# Patient Record
Sex: Male | Born: 1935 | Race: White | Hispanic: No | Smoking: Never smoker
Health system: Southern US, Community
[De-identification: ages and names within clinical notes are randomized; demographics above are authoritative.]

## PROBLEM LIST (undated history)

## (undated) DIAGNOSIS — M810 Age-related osteoporosis without current pathological fracture: Secondary | ICD-10-CM

## (undated) DIAGNOSIS — I495 Sick sinus syndrome: Secondary | ICD-10-CM

## (undated) DIAGNOSIS — H3530 Unspecified macular degeneration: Secondary | ICD-10-CM

## (undated) DIAGNOSIS — H9190 Unspecified hearing loss, unspecified ear: Secondary | ICD-10-CM

## (undated) HISTORY — DX: Age-related osteoporosis without current pathological fracture: M81.0

## (undated) HISTORY — DX: Unspecified macular degeneration: H35.30

## (undated) HISTORY — DX: Sick sinus syndrome: I49.5

---

## 1998-04-22 ENCOUNTER — Inpatient Hospital Stay (HOSPITAL_COMMUNITY): Admission: EM | Admit: 1998-04-22 | Discharge: 1998-04-22 | Payer: Self-pay | Admitting: Emergency Medicine

## 1998-04-22 ENCOUNTER — Encounter: Payer: Self-pay | Admitting: Emergency Medicine

## 1998-04-23 ENCOUNTER — Ambulatory Visit (HOSPITAL_COMMUNITY): Admission: RE | Admit: 1998-04-23 | Discharge: 1998-04-23 | Payer: Self-pay | Admitting: Internal Medicine

## 1998-04-23 ENCOUNTER — Encounter: Payer: Self-pay | Admitting: Internal Medicine

## 1998-05-14 ENCOUNTER — Ambulatory Visit (HOSPITAL_COMMUNITY): Admission: RE | Admit: 1998-05-14 | Discharge: 1998-05-14 | Payer: Self-pay | Admitting: Cardiology

## 1998-11-20 ENCOUNTER — Ambulatory Visit (HOSPITAL_COMMUNITY): Admission: RE | Admit: 1998-11-20 | Discharge: 1998-11-20 | Payer: Self-pay

## 1998-11-21 ENCOUNTER — Inpatient Hospital Stay (HOSPITAL_COMMUNITY): Admission: RE | Admit: 1998-11-21 | Discharge: 1998-11-24 | Payer: Self-pay

## 1999-02-13 ENCOUNTER — Ambulatory Visit (HOSPITAL_COMMUNITY): Admission: RE | Admit: 1999-02-13 | Discharge: 1999-02-13 | Payer: Self-pay | Admitting: Gastroenterology

## 2000-11-24 ENCOUNTER — Encounter: Payer: Self-pay | Admitting: Specialist

## 2000-11-29 ENCOUNTER — Inpatient Hospital Stay (HOSPITAL_COMMUNITY): Admission: RE | Admit: 2000-11-29 | Discharge: 2000-12-04 | Payer: Self-pay | Admitting: Specialist

## 2005-07-09 ENCOUNTER — Encounter: Admission: RE | Admit: 2005-07-09 | Discharge: 2005-07-09 | Payer: Self-pay | Admitting: General Surgery

## 2005-07-13 ENCOUNTER — Encounter (INDEPENDENT_AMBULATORY_CARE_PROVIDER_SITE_OTHER): Payer: Self-pay | Admitting: Pathology

## 2005-07-13 ENCOUNTER — Ambulatory Visit (HOSPITAL_BASED_OUTPATIENT_CLINIC_OR_DEPARTMENT_OTHER): Admission: RE | Admit: 2005-07-13 | Discharge: 2005-07-13 | Payer: Self-pay | Admitting: General Surgery

## 2007-08-08 ENCOUNTER — Inpatient Hospital Stay (HOSPITAL_COMMUNITY): Admission: RE | Admit: 2007-08-08 | Discharge: 2007-08-12 | Payer: Self-pay | Admitting: Orthopedic Surgery

## 2010-10-14 NOTE — H&P (Signed)
[name], [name]              ACCOUNT NO.:  [id number]   MEDICAL RECORD NO.:  [id number]          PATIENT TYPE:  INP   LOCATION:  1617                         FACILITY:  [facility]   PHYSICIAN:  Ollen Gross, M.D.    DATE OF BIRTH:  December 19, 1924   DATE OF ADMISSION:  08/08/2007  DATE OF DISCHARGE:                              HISTORY & PHYSICAL   DATE OF OFFICE VISIT:  The history and physical was done on July 28, 2007.  The date of hospital admission is August 08, 2007.   CHIEF COMPLAINT:  Right knee pain.   HISTORY OF PRESENT ILLNESS:  The patient is an 77 year old male who has  been seen by Dr. Lequita Halt for ongoing right knee pain.  He has undergone  left total knee back in 2000 per Dr. Thomasena Edis, and doing well with that.  The right knee has been hurting even prior to having the left knee; and  this has been ongoing for several years now.  He is at a point where he  would like to have something done.  He has been seen by Dr. Lequita Halt and  found to have end-stage arthritis; felt to be a good candidate.  He has  been seen preoperatively by Dr. Rodrigo Ran and felt to be stable for  upcoming surgery.   ALLERGIES:  NO KNOWN DRUG ALLERGIES.   CURRENT MEDICATIONS:  1. Ambien, but he is not using that on a regular basis now.  2. Aspirin 81 mg .  3. PreserVision.   PAST MEDICAL HISTORY:  Negative.   PAST SURGICAL HISTORY:  1. Left knee replacement 2000.  2. Gallbladder surgery in late 1990s.  3. Cataract surgery.   FAMILY HISTORY:  Father with heart attack.  Mother old age.   SOCIAL HISTORY:  Widowed.  Nonsmoker.  No alcohol.  Two children.  Lives  alone.  States he has someone  Lined up to help him after surgery.   REVIEW OF SYSTEMS:  GENERAL:  No fevers, chills or night sweats.  NEUROLOGIC:  No seizures, syncope or paralysis.  RESPIRATORY:  No shortness of breath, productive cough or hemoptysis.  CARDIOVASCULAR:  No chest pain, angina or orthopnea.  GI:  No nausea,  vomiting, diarrhea or constipation.  GU:  No dysuria, hematuria or discharge.  MUSCULOSKELETAL:  Right knee.   PHYSICAL EXAMINATION:  VITAL SIGNS:  Pulse 72, respirations 12, blood  pressure 124/64.  GENERAL:  An 77 year old white male well-nourished, well-developed, in  no acute distress.  Alert, oriented and cooperative.  Good historian.  HEENT:  Normocephalic, atraumatic.  Pupils are equal, round and  reactive.  Oropharynx clear.  EOMs intact.  Lower partial plate.  NECK:  Supple.  CHEST:  Clear at anterior posterior chest walls.  HEART:  Regular rate and rhythm.  No murmur, S1 and S2.  ABDOMEN:  Soft, nontender.  Bowel sounds present.  BREASTS/GENITALIA:  Not done, not pertinent to present illness.  EXTREMITIES:  Right knee varus malalignment and deformity.  Range of  motion 5-125.  No effusion, marked crepitus, tender more medial than  lateral.   IMPRESSION:  Osteoarthritis of the right knee.   PLAN:  The patient admitted to [facility] to undergo a right  total knee replacement arthroplasty.  Surgery will be performed by Dr.  Ollen Gross.      [name], P.A.C.      Ollen Gross, M.D.  Electronically Signed    ALP/MEDQ  D:  08/07/2007  T:  08/08/2007  Job:  [id number]   cc:   Loraine Leriche A. Perini, M.D.  Fax: [phone]

## 2010-10-14 NOTE — Op Note (Signed)
NAMEBENJY, KANA              ACCOUNT NO.:  [id number]   MEDICAL RECORD NO.:  [id number]          PATIENT TYPE:  INP   LOCATION:  0005                         [facility]ACILITY[facility]  [facility]   PHYSICIAN:  Ollen Gross, M.D.    DATE OF BIRTH:  Mar 19, 1925   DATE OF PROCEDURE:  08/08/2007  DATE OF DISCHARGE:                               OPERATIVE REPORT   PREOPERATIVE DIAGNOSIS:  Osteoarthritis, right knee.   POSTOPERATIVE DIAGNOSIS:  Osteoarthritis, right knee.   PROCEDURE:  Right total knee arthroplasty.   SURGEON:  Homero Fellers, MD   ASSISTANT:  Avel Peace PA-C   ANESTHESIA:  Spinal with Duramorph.   ESTIMATED BLOOD LOSS:  Minimal.   DRAINS:  None.   TOURNIQUET TIME:  Forty-one minutes at 300 mmHg.   COMPLICATIONS:  None.   CONDITION:  Stable, to Recovery.   CLINICAL NOTE:  Mr. Ching is an 77 -year-old male with severe end-stage  arthritis of the right knee with progressively worsening pain and  dysfunction.  He has had a previous successful left total knee  arthroplasty and presents now for right total knee arthroplasty.   PROCEDURE IN DETAIL:  After the successful administration of spinal  anesthetic, a tourniquet was placed high on the right thigh and right  lower extremity prepped and draped in the usual sterile fashion.  Extremities was wrapped in Esmarch, knee flexed and tourniquet inflated  to 300 mmHg.  A midline incision was made with 10 blade through  subcutaneous tissue to the level of the extensor mechanism.  A fresh  blade was used make a medial parapatellar arthrotomy.  Soft tissue over  the proximal medial tibia was subperiosteally elevated to the joint line  with the knife and into the semimembranosus bursa with a Cobb elevator.  Soft tissue laterally is elevated with attention being paid to avoiding  the patellar tendon on the tibial tubercle.  Patella was subluxed  laterally, knee flexed to 90 degrees, ACL and PCL removed.  Drill was  used create a starting  hole in the distal femur and the canal was  thoroughly irrigated.  A 5-degree right valgus alignment guide was  placed and referencing off the posterior condyles, rotation was marked  and the block pinned to remove 11 mm off the distal femur; I took 11  because of preop flexion contracture.  Distal femoral resection was made  with an oscillating saw.  Sizing block was placed and size 5 is most  appropriate.  Rotations marked at the epicondylar axis.  A size 5  cutting block was placed and the anterior, posterior and chamfer cuts  were made.   Tibia was subluxed forward and the menisci were removed.  The  extramedullary tibial alignment guide was placed, referencing proximally  at the medial aspect of the tibial tubercle and distally along the 2nd  metatarsal axis and tibial crest.  A block was pinned to remove 10 mm  off the non-deficient lateral side.  Tibial resection was made with an  oscillating saw.  A size 5 was most appropriate tibial component and the  proximal tibia was prepared with  a modular drill and keel punch for a  size 5.  Femoral preparation was completed with the intercondylar cut.   A size 5 mobile bearing tibial trial, a size 5 posterior stabilized  femoral trial and a 10-mm posterior stabilized rotating platform insert  trial were placed.  With the 10, full extension was achieved with a tiny  bit of varus-valgus placed.  I went to 12.5, which still allowed for  full extension with excellent varus and valgus balance and anterior-  posterior balance throughout full range of motion.  The patella was then  everted and thickness measured to be 25 mm.  Freehand resection was  taken to 13 mm, a 41 template was placed, lug holes were drilled, trial  patella was placed and it tracked normally.  Osteophytes were removed  off the posterior femur with the trial in place.  All trials were  removed and the cut bone surfaces were prepared with pulsatile lavage.  Cements was mixed  and once ready for implantation, a size 5 mobile  bearing tibial tray, a size 5 posterior stabilized femur and 41 patella  were cemented into place and the patella was held with a clamp.  Trial  12.5 insert was placed and knee held in full extension and all extruded  cement removed.  When the cement had fully hardened, then the wound was  copiously irrigated with saline solution.  FloSeal was injected on the  posterior capsule and the permanent 12.5-mm posterior stabilized  rotating platform insert was placed into the tibial tray.  FloSeal was  injected in the mediolateral gutters and suprapatellar area.  Moist  sponge was placed and tourniquet released for total time of 41 minutes.  We  held the sponge for 2 minutes, then removed it.  Minimal bleeding  was encountered; that which was encountered was stopped with  electrocautery.  I then irrigated again and closed the arthrotomy with  interrupted #1 PDS.  Flexion against gravity was 140 degrees.  Subcu was  closed with interrupted 2-0 Vicryl and subcuticular running 4-0  Monocryl.  The incisions was cleaned and dried and Steri-Strips and a  bulky sterile dressing applied.  He was placed into a knee immobilizer,  awakened and transported to Recovery in stable condition.      Ollen Gross, M.D.  Electronically Signed     FA/MEDQ  D:  08/08/2007  T:  08/09/2007  Job:  [id number]

## 2010-10-14 NOTE — Discharge Summary (Signed)
NAMEASHKAN, CHAMBERLAND              ACCOUNT NO.:  [id number]   MEDICAL RECORD NO.:  [id number]          PATIENT TYPE:  INP   LOCATION:  1617                         FACILITY:  [facility]   PHYSICIAN:  Ollen Gross, M.D.    DATE OF BIRTH:  [birth year]   DATE OF ADMISSION:  08/08/2007  DATE OF DISCHARGE:  08/12/2007                               DISCHARGE SUMMARY   ADMISSION DIAGNOSIS:  Osteoarthritis, right knee.   DISCHARGE DIAGNOSES:  1. Osteoarthritis, right knee, status post right total knee      replacement arthroplasty.  2. Mild postoperative hypoxia, resolved.  3. Mild postoperative probable atelectasis.   PROCEDURE:  August 08, 2007:  Right total knee.  Surgeon Dr. Lequita Halt,  assistant Avel Peace, P.A.C.  Surgery under spinal with Duramorph.   CONSULTATIONS:  None.   BRIEF HISTORY:  Mr. Krist is an 77 year old male with severe end-stage  arthritis of the right knee with progressive worsening pain and  dysfunction, successful left total knee, now presents for right total  knee.   LABORATORY DATA:  Preop CBC showed a hemoglobin of 15.3, hematocrit of  44.4, white cell count 8.1, red cell count 4.93, platelets 219.  Preop  UA only showed trace leukocyte esterase, 3 to 6 white cells, 0 to 2 red  cells, rare bacteria.  Chem panel on admission:  Slightly high protein  of 8.5.  Remaining chem panel within normal limits.  Protime INR 13.9  with 1.1 and PTT of 29.  Serial CBCs were followed.  Postop hemoglobin  from 11.4 drifted down to 9.8.  Last noted H&H was 9.8 and 27.9.  Serial  protimes followed, PT/INR 20.2, 1.7.  Serial BMETs were followed.  Electrolytes remained within normal limits.   X-rays:  Preop chest x-ray dated July 09, 2005.  No active  cardiopulmonary disease.  Portable chest August 10, 2007.  Opacity left  lung base with curvilinear superior margin.  Finding could represent an  elevation of the left hemidiaphragm.  Could be a new finding compared to  2007.   Potentially consolidation or atelectasis, left lung base.  Evaluate PA and lateral chest.  Hazy opacity, medial right lung base is  favored, representing crowding or pulmonary vascular related to lung  volumes and atelectasis.  Followup chest again on August 10, 2007:  Re-  expansion of the previously identified left basilar opacity consistent  with resolution of atelectasis, borderline cardiomegaly on current exam.  No evidence of acute cardiopulmonary disease.  Spiral CT taken on August 11, 2007:  Do not have the final report but preliminary report read as  no acute infiltrate, no pulmonary embolism, small right pleural  effusion, no edema.   EKG June 15, 2007:  Normal sinus rhythm, borderline, unable to read  the whole interpretation.  No significant ST/T changes.   HOSPITAL COURSE:  The patient admitted to [facility],  tolerated the procedure well, later transferred to the recovery room on  orthopedic floor, started on PCA and p.o. analgesic pain control  following surgery, given 24 hours postop IV antibiotics.  Started on  Coumadin for DVT  prophylaxis.  Actually doing pretty well on the morning  of day one.  Seen in rounds.  Starting to get up out of bed.  Hemoglobin  was 11.4.  Had decent output, a little bit on the lower side, so we  increased his fluids, gave him a mild fluid challenge, and that helped  out with his output, which was much better on day two.  Seen in rounds,  starting feeling okay.  Discontinued the PCA and the fluids, although  later on that day it was noted that he was having a little bit of drop  in his O2 sats when he was up moving around and talking.  It got down  into the mid 80s while he was on nasal cannula.  We checked it on room  air and it was down to the 80s, put him back on the nasal cannula and it  was up and down, so we checked an initial chest x-ray.  There was  possible consolidation or atelectasis.  Recommended dedicated films.  We   did do dedicated films later that afternoon and it showed resolution of  probable atelectasis.  He started getting up with therapy and walked  about 40 feet.  By the following day of March 12, he was doing much  better.  X-rays yesterday had improved although he still had a little  bit of a drop in his O2 sat through later that evening and night, so on  the morning rounds we ordered a CT spiral just to make sure he did not  have any emboli.  The CT did prove to be negative.  No infiltrates.  No  emboli.  Incision was checked.  Dressing changes started on day 2 and  checked daily.  Incision was healing well.  He continued to progress  very well, walking 120 feet.  We kept him one more day just to make sure  he was weaned off his O2, and when seen on rounds on August 12, 2007 he  was already sitting up in the bed dressed.  No complaints of shortness  of breath, no complaints of chest pain.  Moving his bowels.  His room  sats were 91.  Tolerating his medications, voiding well, and was  discharged home.   DISCHARGE PLAN:  1. The patient was discharged home on August 12, 2007.  2. Discharge diagnoses:  Please see above.  3. Discharge medications:  Coumadin, Percocet, Robaxin, Nu-Iron.   ACTIVITIES:  He is weightbearing as tolerated to the right lower  extremity, total knee protocol.  Home health PT and home nursing.   DIET:  Resume home diet as tolerated.   FOLLOWUP:  He needs to follow up with Dr. Lequita Halt in about 2 weeks.  Follow up on either Tuesday or Thursday, March 24 or March 26.  Call the  office for an appointment, [phone].   DISPOSITION:  Home.   CONDITION ON DISCHARGE:  Improving.      [name], P.A.C.      Ollen Gross, M.D.  Electronically Signed   ALP/MEDQ  D:  08/12/2007  T:  08/13/2007  Job:  [id number]   cc:   Ollen Gross, M.D.  Fax: [phone]   Mark A. Perini, M.D.  Fax: [phone]

## 2010-10-17 NOTE — Procedures (Signed)
[place]  Patient:    [name], [name]                     MRN: [id number] Proc. Date: 11/29/00 Adm. Date:  [id number] Attending:  Erasmo Leventhal CC:         Anesthesia Department   Procedure Report  PROCEDURE:  Epidural catheter placement for postoperative pain relief.  DESCRIPTION OF PROCEDURE:  I was consulted by Dr. Eugenia Mcalpine to place an epidural catheter into Mr. [name] for postoperative pain relief.  I had the opportunity to discuss the procedure with the patient, and he agreed to the catheter placement.  At the end of the procedure, prior to emergence from anesthesia, the patient was turned into the right lateral decubitus position.  His back was prepped with Betadine.  Using a #17 Tuohy needle, the epidural space was easily cannulated at the L2-3 interspace in the midline using loss of resistance technique.  A catheter was then placed 5 cm into the epidural space and the needle removed.  After negative aspiration, 100 mcg of fentanyl and 10 cc of 0.5% Xylocaine was injected.  The catheter was then affixed to the patients back.  He was turned supine, extubated, and taken to the recovery room in stable condition.  In the PACU, a fentanyl/morphine infusion was begun.  He will be followed on the floor by the anesthesiology service. DD:  11/29/00 TD:  11/29/00 Job: [id number]

## 2010-10-17 NOTE — Op Note (Signed)
[place]  Patient:    [name], [name]                     MRN: [id number] Proc. Date: 11/29/00 Adm. Date:  [id number] Attending:  Erasmo Leventhal                           Operative Report  PREOPERATIVE DIAGNOSIS:  Left knee end-stage osteoarthritis.  POSTOPERATIVE DIAGNOSIS:  Left knee end-stage osteoarthritis.  PROCEDURE:  Left total knee arthroplasty.  SURGEON:  R. Valma Cava, M.D.  ASSISTANT:  [name], P.A.-C.  ANESTHESIA:  General followed by epidural.  ESTIMATED BLOOD LOSS:  Less than 200 cc.  DRAINS:  Hemovac.  COMPLICATIONS:  None.  TOURNIQUET TIME:  1 hour 23 minutes at 375 mmHg.  IMPLANTS:  Osteonics components all cemented, sizes:  #11 to femur, #11 tibia, 10 mm polyethylene insert, 28 mm patella.  DESCRIPTION OF PROCEDURE:  The patient was counseled in the holding area. Correct size was identified.  IV was started and antibiotics were given.  He was taken to the OR and placed in the supine position under general anesthesia.  Left knee examined.  8 degree flexion contracture, flexed to 120 degrees elevated.  Foley catheter in place, utilizing sterile technique by the OR circulating nurse.  The left lower extremity was elevated, prepped with Duraprep, and draped in the usual sterile fashion.    Exsanguinated with an Esmarch and tourniquet was inflated to 375 mmHg.  Straight midline incision was made through the skin and subcutaneous tissue.  Medial and lateral skin flaps were developed at the appropriate plane.  Medial parapatellar arthrotomy was performed.  Patella was everted.  Medial soft tissue release was done on the tibia.  Cruciate ligaments were resected.  Interestingly an intercondylar notch made it look like there was a ganglion cyst of the PCL, but there was some hemosiderin stain.  This was excised.  The posterior capsule was not violated.  Instead the intra-articular and debrided this  area.  It had a benign appearance.  Nonspecific.  The distal femur canal was entered and irrigated.  Intermedullary rod was gently placed.  I took a 5 degree valgus cut with a 12 mm cut off the distal femur.  The distal femur was found to be a size #9.  Rotational marks were made and distal femur was cut to fit a size #9. Medial and lateral menisci were removed, geniculits were coagulated, tibial hemorrhage was resected and osteophytes removed from the proximal medial tibia.  We had bone-on-bone contact with severe grooving on the medial side.  The proximal tibia was found to be a size #11.  It was cut to a depth of 10 mm initially with a 5 degree posterior slope had been set and then went back later and took another 2 mm off.  Posterior medial and posterolateral femoral osteophytes were removed under direct visualization.  Femoral trochlea was prepared in the standard fashion.  With trials of 11 femur, 11 tibia, 10 mm polyethylene insert at this point in time we had excellent range of motion and soft tissue balance.  Rotational marks were made in the depth of keel was performed in standard fashion.  The patella was found to be a size 28 and was reamed to a depth of 10 mm. Locking holes were made and excess bone was removed.  At this point in time utilizing modern cement  technique, all components were cemented in place, size #11 tibia, size #11 femur, 28 mm patella.  At this point in time with trials and 10 mm insert, we had excellent range of motion and soft tissue balance. This trial was then removed and 10 mm polyethylene tibial insert was then placed.  Bone wax was placed in the exposed bone areas.  Hemostasis was obtained.  Two medium hemovac drains were placed and a lateral release of the retinaculum and capsule was done.  The synovium was left intact with tracking anatomic.  Sponges were placed, wrapped in Ace wrap, and tourniquet was deflated.  After allowing the knee to coagulate,  hemostasis was obtained.  The knee was irrigated with antibiotic solution during the closure.  A sequential closure in layers were done with the arthrotomy Vicryl, subcu Vicryl, and skin closed with subcuticular monocryl suture.  Benzoin and Steri-Strips were applied.  Drain was then hooked to suction.  Sterile dressings were applied to the knee.  Ice pack and knee immobilizer.  He had excellent pulses in the foot and ankle at the end of the case.  He was given 1 mg of Ancef when the tourniquet was deflated.  He was then turned lateral and the epidural was administered.  He was then taken to the recovery room in stable condition.  No complications.  Sponge, needle, and instrument counts were correct. DD:  11/29/00 TD:  11/29/00 Job: [id number]

## 2010-10-17 NOTE — H&P (Signed)
[place]. [facility]  Patient:    [name], [name]                       MRN: [id number] Adm. Date:  11/29/00 Attending:  R. Valma Cava, M.D. Dictator:   Colleen P. Mahar, P.A.-C. CC:         Rodrigo Ran, M.D.   History and Physical  DATE OF BIRTH:  02-05-1925  CHIEF COMPLAINT:  Left knee pain.  HISTORY OF PRESENT ILLNESS:  Chronic history of bilateral knee pain, left greater than right.  He has failed conservative management.  The pain has been increasing in severity and is currently interfering with activities of daily living.  PAST MEDICAL HISTORY:  The patient is healthy, with no medical problems.  ALLERGIES:  No known drug allergies.  CURRENT MEDICATIONS: 1. Aspirin 81 mg q.d.  He is instructed to stop this as of today. 2. Ocuvite vitamins. 3. Calcium supplement.  PAST SURGICAL HISTORY:  Cholecystectomy.  SOCIAL HISTORY:  He denies tobacco use.  He denies alcohol use.  He lives with his wife.  He plans to go home with home health physical therapy and nursing after surgery if at all possible.  The patients primary medical doctor is Dr. Rodrigo Ran.  FAMILY HISTORY:  Mother deceased at age 30.  She was healthy until the time of her death.  Father is deceased at age 87, with a history of a CVA.  Siblings: History of a myocardial infarction and lung cancer.  REVIEW OF SYSTEMS:  Denies fever, chills, night sweats, bleeding tendencies, blurred vision, double vision, seizures, headaches, paralysis.  Denies shortness of breath, productive cough, hemoptysis.  Denies chest pain, angina, orthopnea, claudication.  Denies nausea, vomiting, constipation, diarrhea, melena, bloody stool.  Denies dysuria, hematuria, or discharge.  Denies any paresthesias, numbness, or weakness in the extremities.  PHYSICAL EXAMINATION:  VITAL SIGNS:  Blood pressure 122/72, respirations 14 and unlabored, pulse 60 and regular.  GENERAL:  The patient is a  77 year old male, in no acute distress.  He is alert and oriented, pleasant, cooperative to examination.  HEENT:  Head normocephalic, atraumatic.  Pupils equal, round, reactive to light.  Extraocular movements intact.  Pharynx clear.  NECK:  No bruits appreciated.  Soft to palpation.  No thyromegaly.  CHEST:  Clear to auscultation bilaterally.  BREASTS:  Not done, not pertinent.  HEART:  A regular rate and rhythm.  No murmurs, gallops, or rubs.  Normal S1, S2.  ABDOMEN:  Positive bowel sounds throughout.  Soft, supple, nondistended, nontender.  No organomegaly noted.  GENITOURINARY:  Not pertinent, not performed.  EXTREMITIES:  Left knee:  Varus deformity.  Range of motion is 5-110 degrees. There is crepitation with range of motion.  There is diffuse medial joint line tenderness.  SKIN:  Intact.  No erythema, rashes, or lesions.  X-RAYS:  Revealed end-stage osteoarthritis, bilateral varus malalignment of his knees, bone on bone contact bilateral knees, left greater than right.  IMPRESSION:  End-stage osteoarthritis, left knee greater than right knee.  PLAN:  Admit to [facility] on November 29, 2000, for a left knee arthroplasty by Dr. Benny Lennert.  Medical clearance was obtained from Dr. Waynard Edwards for this surgery. DD:  11/22/00 TD:  11/22/00 Job: [id number]

## 2010-10-17 NOTE — Op Note (Signed)
NAME:  [name], [name]              ACCOUNT NO.:  [id number]   MEDICAL RECORD NO.:  [id number]          PATIENT TYPE:  AMB   LOCATION:  DSC                          FACILITY:  [facility]   PHYSICIAN:  Gabrielle Dare. Janee Morn, M.D.DATE OF BIRTH:  27-Apr-1925   DATE OF PROCEDURE:  07/13/2005  DATE OF DISCHARGE:                                 OPERATIVE REPORT   PREOPERATIVE DIAGNOSIS:  Melanoma, left shoulder.   POSTOPERATIVE DIAGNOSIS:  Melanoma, left shoulder.   PROCEDURE:  Wide excision melanoma, left shoulder.   SURGEON:  Violeta Gelinas.   HISTORY OF PRESENT ILLNESS:  The patient is a 77 year old male who I  evaluated in the office for a 0.25 mm in thickness superficial spreading  melanoma of his left shoulder. He presents for wide excision.   PROCEDURE IN DETAIL:  Informed consent was obtained.  The patient was  identified. He received intravenous antibiotics. His site was marked. He is  brought to operating room. MAC anesthesia was administered. His left  shoulder was prepped and draped in sterile fashion.  0.25% Marcaine with  epinephrine was injected in a large area surrounding the lesion. A ruler was  used to measure out a greater than 1 cm boundary circumferentially and this  was fashioned into an ellipse shape to facilitate closure.  Incision was  made along this elliptical tracing. Subcutaneous tissues were dissected down  to the fascia and the ellipse was taken and was removed in its entirety  using Bovie cautery to get excellent hemostasis.  The specimen was marked  for orientation and sent to pathology.  The wound was copiously irrigated,  additional local anesthetic was injected. Flaps were raised posteriorly and  anteriorly to facilitate closure. Subsequently meticulous hemostasis was  ensured. Subcutaneous tissues were reapproximated with interrupted 2-0  Vicryl sutures. The skin was closed with interrupted 3-0 nylon sutures.  Sponge, needle and instrument counts were correct.  A sterile dressing was  applied. The patient tolerated the procedure well without apparent  complication was taken to the recovery room in stable condition.      Gabrielle Dare Janee Morn, M.D.  Electronically Signed     BET/MEDQ  D:  07/13/2005  T:  07/14/2005  Job:  [id number]   cc:   Loraine Leriche A. Perini, M.D.  Fax: [phone]   Dollene Cleveland, M.D.  Fax: [phone]

## 2010-10-17 NOTE — Discharge Summary (Signed)
[place]  Patient:    [name], [name]                     MRN: [id number] Adm. Date:  [id number] Disc. Date: [id number] Attending:  Erasmo Leventhal Dictator:   Sammuel Cooper. Mahar, P.A. CC:         Rodrigo Ran, M.D.   Discharge Summary  DATE OF BIRTH:  12/18/1924  ADMISSION DIAGNOSIS:  End-stage osteoarthritis, left knee.  DISCHARGE DIAGNOSES: 1. Status post left total knee arthroplasty. 2. Postoperative hemorrhagic anemia which was mild and asymptomatic.  PROCEDURES:  Left total knee arthroplasty by R. Valma Cava, M.D., with the assistance of [name], P.A.-C.  The patient was under general anesthesia.  CONSULTS:  Eligha Bridegroom. [facility].  BRIEF HISTORY:  The patient is a 77 year old male who has a chronic long-term history of bilateral knee pain with the left being more significant than the right.  He has tried and failed various methods of conservative management. The pain has been increasing in severity over the past month and currently to the point of interfering with activities of daily living and near constant. We discussed the risks and benefits of surgery with R. Valma Cava, M.D., and they decided together that this would be the best course of action for him and decided to proceed.  LABORATORY DATA:  The previous admission CBC was within normal limits. Postoperative H&Hs were followed.  On December 02, 2000, the H&H was at its low of 9.6 and 27.6.  The patient was asymptomatic and did not require transfusion. PT and INR were followed postoperatively.  The patient was on Coumadin and did reach a therapeutic range of 19.0 and 2.0, respectively, on December 02, 2000, prior to discharge. On the day of discharge, they were 24.6 and 3.2, respectively.  Preadmission routine chemistries were within normal limits with the exception of a slightly elevated CO2 at 33.  This was monitored two  days postoperatively and remained within normal limits with the exception of a slightly elevated glucose of 119 and a slight low calcium of 8.3.  On December 01, 2000, he had a slightly elevated CO2 of 33, a glucose of 118, and a slightly depressed calcium of 8.3.  Otherwise within normal limits.  The UA preadmission was negative.  X-RAYS:  A chest x-ray done on November 20, 2000, was negative for any active disease and without any interval changes.  There are no other x-ray reports seen on the chart.  ELECTROCARDIOGRAMS:  The EKG from November 03, 2000, revealed sinus bradycardia and a rate of 48.  The rest of reading is not legible.  HOSPITAL COURSE:  The patient was taken to the operating room on November 29, 2000, for a left total knee arthroplasty by R. Valma Cava, M.D.  He tolerated the procedure very well without any complications and was transferred to the recovery room in stable condition.  Two Hemovac drains were placed intraoperatively.  Postoperatively, the patient was placed on IV Ancef 1 g q.8h.  He completed this course without any difficulty or complications.  On postoperative day #1, the Hemovac drains were pulled without any difficulty. His Coumadin was started on postoperative day #1 at 4 p.m.  This was managed by pharmacy.  Postoperatively, the patient was put on an epidural after pain control.  He tolerated this very well without any complications.  On postoperative day #2, this was discontinued.  Pain control was  done with p.o. analgesics at that point and we maintained good control of his pain.  The dressing was taken down on postoperative day #2.  The incision looked good without any erythema or signs of infection.  Physical therapy and occupational therapy were both consulted to work with the patient postoperatively and he did very well with them, progressing on a total knee protocol as expected, eventually ambulating approximately 300 feet with just  supervision. Neurovascular checks were done on the left lower extremity daily and he remained neurovascularly intact throughout his hospital stay.  Dressing changes were also done daily and the incision looked good throughout his hospital stay without any signs of infection.  The patient did develop mild postoperative hemorrhagic anemia with a hemoglobin reaching a low of 9.6 on December 02, 2000.  However, the patient was asymptomatic to that anemia and did not require blood transfusions during his hospital stay.  A rehabilitation consult was ordered on November 30, 2000, and they found that the patient had been doing very well with physical therapy and did not think he would need any inpatient rehabilitation.  On December 04, 2000, the patient was thought to have met all orthopedic goals and was thought to be safe for discharge to his home.  MEDICATIONS ON DISCHARGE: 1. Percocet, #40, one to two tablets p.o. q.4-6h. p.r.n. pain. 2. Robaxin 500 mg one tablet p.o. q.6-8h. p.r.n. spasm, #40. 3. Trinsicon one tablet p.o. b.i.d., #60. 4. Coumadin which will be dosed per pharmacy.  ACTIVITY:  The patient is weightbearing as tolerated.  He is to use a straight leg immobilizer to the left knee until his first postoperative visit.  WOUND CARE:  He is to do dressing changes q.d.  Instructions were given for this.  He may shower on postoperative day #5.  FOLLOW-UP:  He is to follow up with R. Valma Cava, M.D., two weeks postoperatively and call for an appointment for that.  DIET:  He is to resume his preoperative diet as tolerated.  DISPOSITION:  He is discharged home with [facility] for home health physical therapy, as well as home health nursing to monitor the patient while he is on Coumadin.  CONDITION ON DISCHARGE:  Stable and improved.  PRIMARY CARE DOCTOR:  Rodrigo Ran, M.D. DD:  12/24/00 TD:  12/25/00 Job: [id number]

## 2011-02-23 LAB — CBC
HCT: 27.9 — ABNORMAL LOW
HCT: 28.9 — ABNORMAL LOW
HCT: 32.4 — ABNORMAL LOW
Hemoglobin: 10.3 — ABNORMAL LOW
Hemoglobin: 11.4 — ABNORMAL LOW
Hemoglobin: 9.8 — ABNORMAL LOW
MCHC: 34.5
MCHC: 35.2
MCHC: 35.7
MCV: 89.1
MCV: 89.1
MCV: 89.3
MCV: 90.2
Platelets: 155
Platelets: 163
Platelets: 219
RBC: 3.24 — ABNORMAL LOW
RBC: 4.93
RDW: 14.2
RDW: 14.3
RDW: 14.3
WBC: 13.3 — ABNORMAL HIGH

## 2011-02-23 LAB — COMPREHENSIVE METABOLIC PANEL
ALT: 21
AST: 21
Albumin: 3.6
CO2: 29
Calcium: 9
Creatinine, Ser: 0.97
GFR calc Af Amer: >60
Sodium: 139
Total Protein: 8.5 — ABNORMAL HIGH

## 2011-02-23 LAB — URINALYSIS, ROUTINE W REFLEX MICROSCOPIC
Glucose, UA: NEGATIVE
Ketones, ur: NEGATIVE
Nitrite: NEGATIVE
Specific Gravity, Urine: 1.022
pH: 5.5

## 2011-02-23 LAB — BASIC METABOLIC PANEL
BUN: 12
CO2: 29
Chloride: 104
Chloride: 105
GFR calc Af Amer: >60
GFR calc non Af Amer: >60
Glucose, Bld: 126 — ABNORMAL HIGH
Glucose, Bld: 137 — ABNORMAL HIGH
Potassium: 4
Sodium: 136
Sodium: 137

## 2011-02-23 LAB — URINE MICROSCOPIC-ADD ON

## 2011-02-23 LAB — TYPE AND SCREEN

## 2011-02-23 LAB — PROTIME-INR: Prothrombin Time: 20.2 — ABNORMAL HIGH

## 2011-02-23 LAB — ABO/RH: ABO/RH(D): A POS

## 2011-02-23 LAB — APTT: aPTT: 29

## 2012-05-02 ENCOUNTER — Other Ambulatory Visit: Payer: Self-pay | Admitting: Dermatology

## 2012-12-09 ENCOUNTER — Other Ambulatory Visit: Payer: Self-pay | Admitting: Dermatology

## 2013-04-26 ENCOUNTER — Other Ambulatory Visit: Payer: Self-pay | Admitting: Dermatology

## 2014-11-30 HISTORY — PX: PACEMAKER INSERTION: SHX728

## 2015-01-02 ENCOUNTER — Encounter: Payer: Self-pay | Admitting: Nurse Practitioner

## 2015-01-06 NOTE — Progress Notes (Signed)
Electrophysiology Office Note Date: 01/07/2015  ID:  [name], DOB [birth year], MRN [id number]  PCP: Jerlyn Ly, MD Electrophysiologist: Allred (to establish)  CC: to establish for pacemaker follow up  [name] is a 81 y.o. male seen today for Dr Rayann Heman.  He is a long time patient of Dr Joylene Draft and [place] when he developed pre-syncope, nausea, and vomiting.  EMS was called and his heart rates were in the 30's. Echocardiogram and lab work by his report were normal. He underwent pacemaker implantation at that time. Since device implantation, he reports doing very well.  He remains very active with daily exercises and mowing his lawn.  He denies chest pain, palpitations, dyspnea, PND, orthopnea, nausea, vomiting, dizziness, syncope.  Device History: MDT dual chamber PPM implanted 2016 for SSS   Past Medical History  Diagnosis Date  . Macular degeneration   . Osteoporosis   . Sick sinus syndrome     a. s/p MDT dual chamber PPM implant 11/2014   Past Surgical History  Procedure Laterality Date  . Pacemaker insertion  11/2014    MDT dual chamber PPM implanted for SSS     Current Outpatient Prescriptions  Medication Sig Dispense Refill  . alendronate (FOSAMAX) 70 MG tablet Take 70 mg by mouth once a week. Take with a full glass of water on an empty stomach.    . Ascorbic Acid (VITAMIN C) 1000 MG tablet Take 1,000 mg by mouth daily.    . cholecalciferol (VITAMIN D) 1000 UNITS tablet Take 1,000 Units by mouth daily.    . cyanocobalamin 1000 MCG tablet Take 100 mcg by mouth daily.     No current facility-administered medications for this visit.    Allergies:   Review of patient's allergies indicates not on file.   Social History: History   Social History  . Marital Status: Married    Spouse Name: N/A  . Number of Children: N/A  . Years of Education: N/A   Occupational History  . Not on file.   Social History Main Topics  . Smoking  status: Never Smoker   . Smokeless tobacco: Not on file  . Alcohol Use: Not on file  . Drug Use: Not on file  . Sexual Activity: Not on file   Other Topics Concern  . Not on file   Social History Narrative  . No narrative on file    Family History: No family history on file.   Review of Systems: All other systems reviewed and are otherwise negative except as noted above.   Physical Exam: VS:  BP 132/76 mmHg  Pulse 69  Ht 5\' 10"  (1.778 m)  Wt 176 lb 6.4 oz (80.015 kg)  BMI 25.31 kg/m2 , BMI Body mass index is 25.31 kg/(m^2).  GEN- The patient is elderly appearing, alert and oriented x 3 today.   HEENT: normocephalic, atraumatic; sclera clear, conjunctiva pink; hearing intact; oropharynx clear; neck supple, no JVP Lymph- no cervical lymphadenopathy Lungs- Clear to ausculation bilaterally, normal work of breathing.  No wheezes, rales, rhonchi Heart- Regular rate and rhythm, pectus excavatum GI- soft, non-tender, non-distended, bowel sounds present  Extremities- no clubbing, cyanosis, or edema; DP/PT/radial pulses 1+ bilaterally MS- no significant deformity or atrophy Skin- warm and dry, no rash or lesion; PPM pocket well healed, some flatulence over pocket, but no signs of infection Psych- euthymic mood, full affect Neuro- strength and sensation are intact  PPM Interrogation- reviewed in detail today,  See PACEART report  EKG:  EKG is ordered today. The ekg ordered today shows atrial pacing, rate 69, intrinsic ventricular conduction, IVCD, QRS 154  Recent Labs: No results found for requested labs within last 365 days.   Wt Readings from Last 3 Encounters:  01/07/15 176 lb 6.4 oz (80.015 kg)     Other studies Reviewed: Additional studies/ records that were reviewed today include: PCP's records  Assessment and Plan:  1.  Symptomatic bradycardia  Normal PPM function, wound well healed See Claudia Desanctis Art report No changes today Follow up with Dr Rayann Heman 03/2015 for 3  month post implant visit   Current medicines are reviewed at length with the patient today.   The patient does not have concerns regarding his medicines.  The following changes were made today:  none  Labs/ tests ordered today include:  Orders Placed This Encounter  Procedures  . EKG 12-Lead     Disposition:   Follow up with Dr Rayann Heman 03/2015 for 3 month post implant pacemaker check.  He has a MyCarelink remote monitor at home, will start those transmissions after 3 month visit.    Signed, Chanetta Marshall, NP 01/07/2015 10:31 AM  [facility] [address] [place] [address] [phone] (office) [phone] (fax)

## 2015-01-07 ENCOUNTER — Ambulatory Visit (INDEPENDENT_AMBULATORY_CARE_PROVIDER_SITE_OTHER): Payer: Medicare Other | Admitting: Nurse Practitioner

## 2015-01-07 ENCOUNTER — Other Ambulatory Visit: Payer: Self-pay | Admitting: Internal Medicine

## 2015-01-07 ENCOUNTER — Encounter: Payer: Self-pay | Admitting: Nurse Practitioner

## 2015-01-07 VITALS — BP 132/76 | HR 69 | Ht 70.0 in | Wt 176.4 lb

## 2015-01-07 DIAGNOSIS — R001 Bradycardia, unspecified: Secondary | ICD-10-CM

## 2015-01-07 DIAGNOSIS — H3530 Unspecified macular degeneration: Secondary | ICD-10-CM | POA: Diagnosis not present

## 2015-01-07 DIAGNOSIS — I495 Sick sinus syndrome: Secondary | ICD-10-CM

## 2015-01-07 LAB — CUP PACEART INCLINIC DEVICE CHECK
Brady Statistic AS VP Percent: 0.7 %
Lead Channel Pacing Threshold Amplitude: 0.625 V
Lead Channel Pacing Threshold Pulse Width: 0.4 ms
Lead Channel Pacing Threshold Pulse Width: 0.4 ms
Lead Channel Sensing Intrinsic Amplitude: 9.1 mV
Lead Channel Setting Pacing Amplitude: 3.5 V
MDC IDC MSMT LEADCHNL RA IMPEDANCE VALUE: 380 Ohm
MDC IDC MSMT LEADCHNL RA PACING THRESHOLD AMPLITUDE: 0.5 V
MDC IDC MSMT LEADCHNL RA SENSING INTR AMPL: 2.1 mV
MDC IDC MSMT LEADCHNL RV IMPEDANCE VALUE: 551 Ohm
MDC IDC SESS DTM: 20160815152531
MDC IDC SET LEADCHNL RV PACING AMPLITUDE: 3.5 V
MDC IDC SET LEADCHNL RV PACING PULSEWIDTH: 0.4 ms
MDC IDC STAT BRADY AP VP PERCENT: 3.2 %
MDC IDC STAT BRADY AP VS PERCENT: 49.7 %
MDC IDC STAT BRADY AS VS PERCENT: 46.4 %

## 2015-01-07 NOTE — Patient Instructions (Addendum)
Medication Instructions:   Your physician recommends that you continue on your current medications as directed. Please refer to the Current Medication list given to you today.    Labwork:  NONE ORDER TODAY    Testing/Procedures:  NONE ORDER TODAY    Follow-Up:   WITH DR Rayann Heman AFTER 03/23/15 NEW PT TO ESTABLISH PPM CHECK    Any Other Special Instructions Will Be Listed Below (If Applicable).

## 2015-03-20 DIAGNOSIS — M810 Age-related osteoporosis without current pathological fracture: Secondary | ICD-10-CM | POA: Insufficient documentation

## 2015-03-25 ENCOUNTER — Encounter: Payer: Self-pay | Admitting: Internal Medicine

## 2015-03-25 ENCOUNTER — Ambulatory Visit (INDEPENDENT_AMBULATORY_CARE_PROVIDER_SITE_OTHER): Payer: Medicare Other | Admitting: Internal Medicine

## 2015-03-25 VITALS — BP 140/88 | HR 74 | Ht 70.0 in | Wt 176.6 lb

## 2015-03-25 DIAGNOSIS — I495 Sick sinus syndrome: Secondary | ICD-10-CM | POA: Diagnosis not present

## 2015-03-25 LAB — CUP PACEART INCLINIC DEVICE CHECK
Brady Statistic AP VP Percent: 3.4 %
Brady Statistic AP VS Percent: 73.76 %
Brady Statistic AS VP Percent: 0.46 %
Brady Statistic RA Percent Paced: 77.16 %
Brady Statistic RV Percent Paced: 3.86 %
Implantable Lead Implant Date: 20160724
Implantable Lead Location: 753860
Implantable Lead Model: 5076
Lead Channel Impedance Value: 418 Ohm
Lead Channel Impedance Value: 532 Ohm
Lead Channel Pacing Threshold Amplitude: 0.5 V
Lead Channel Pacing Threshold Pulse Width: 0.4 ms
Lead Channel Sensing Intrinsic Amplitude: 8.125 mV
Lead Channel Setting Pacing Amplitude: 1.5 V
Lead Channel Setting Sensing Sensitivity: 0.9 mV
MDC IDC LEAD IMPLANT DT: 20160724
MDC IDC LEAD LOCATION: 753859
MDC IDC MSMT BATTERY REMAINING LONGEVITY: 128 mo
MDC IDC MSMT BATTERY VOLTAGE: 3.05 V
MDC IDC MSMT LEADCHNL RA IMPEDANCE VALUE: 304 Ohm
MDC IDC MSMT LEADCHNL RA IMPEDANCE VALUE: 361 Ohm
MDC IDC MSMT LEADCHNL RA SENSING INTR AMPL: 3.625 mV
MDC IDC MSMT LEADCHNL RV PACING THRESHOLD AMPLITUDE: 0.5 V
MDC IDC MSMT LEADCHNL RV PACING THRESHOLD PULSEWIDTH: 0.4 ms
MDC IDC SESS DTM: 20161024094133
MDC IDC SET LEADCHNL RV PACING AMPLITUDE: 2 V
MDC IDC SET LEADCHNL RV PACING PULSEWIDTH: 0.4 ms
MDC IDC STAT BRADY AS VS PERCENT: 22.38 %

## 2015-03-25 NOTE — Patient Instructions (Signed)
Medication Instructions:  Your physician recommends that you continue on your current medications as directed. Please refer to the Current Medication list given to you today.   Labwork: None ordered   Testing/Procedures: None ordered   Follow-Up: Your physician wants you to follow-up in: 12 months with Chanetta Marshall, NP You will receive a reminder letter in the mail two months in advance. If you don't receive a letter, please call our office to schedule the follow-up appointment.  Remote monitoring is used to monitor your Pacemaker from home. This monitoring reduces the number of office visits required to check your device to one time per year. It allows [place] to keep an eye on the functioning of your device to ensure it is working properly. You are scheduled for a device check from home on 06/24/15. You may send your transmission at any time that day. If you have a wireless device, the transmission will be sent automatically. After your physician reviews your transmission, you will receive a postcard with your next transmission date.     Any Other Special Instructions Will Be Listed Below (If Applicable).

## 2015-03-31 NOTE — Progress Notes (Signed)
Electrophysiology Office Note Date: 03/31/2015  ID:  [name], [name] 1924-09-30, MRN [id number]  PCP: Jerlyn Ly, MD Electrophysiologist: Rayann Heman   CC: to establish for pacemaker follow up  [name] is a 81 y.o. male.  He is a long time patient of Dr Joylene Draft and [place] the [place] when he developed pre-syncope, nausea, and vomiting.  EMS was called and his heart rates were in the 30's. Echocardiogram and lab work by his report were normal. He underwent pacemaker implantation at that time. Since device implantation, he reports doing very well.  He remains very active with daily exercises and mowing his lawn.  He denies chest pain, palpitations, dyspnea, PND, orthopnea, nausea, vomiting, dizziness, syncope.  Device History: MDT dual chamber PPM implanted 2016 for SSS   Past Medical History  Diagnosis Date  . Macular degeneration   . Osteoporosis   . Sick sinus syndrome [facility])     a. s/p MDT dual chamber PPM implant 11/2014   Past Surgical History  Procedure Laterality Date  . Pacemaker insertion  11/2014    MDT dual chamber PPM implanted for SSS     Current Outpatient Prescriptions  Medication Sig Dispense Refill  . Ascorbic Acid (VITAMIN C) 1000 MG tablet Take 1,000 mg by mouth daily.    . cholecalciferol (VITAMIN D) 1000 UNITS tablet Take 1,000 Units by mouth daily.    . cyanocobalamin 1000 MCG tablet Take 100 mcg by mouth daily.    Marland Kitchen OVER THE COUNTER MEDICATION Take 1 tablet by mouth for eyes    . zolpidem (AMBIEN) 10 MG tablet Take 5 mg by mouth at bedtime as needed for sleep.   0   No current facility-administered medications for this visit.    Allergies:   Review of patient's allergies indicates not on file.   Social History: Social History   Social History  . Marital Status: Married    Spouse Name: N/A  . Number of Children: N/A  . Years of Education: N/A   Occupational History  . Not on file.   Social History Main Topics  .  Smoking status: Never Smoker   . Smokeless tobacco: Not on file  . Alcohol Use: Not on file  . Drug Use: Not on file  . Sexual Activity: Not on file   Other Topics Concern  . Not on file   Social History Narrative    Family History: Family History  Problem Relation Age of Onset  . Other Mother     OLD AGE  . Heart attack Brother   . Cancer Brother   . Cancer Brother   . Stroke Father   . Healthy Son   . Healthy Daughter      Review of Systems: All other systems reviewed and are otherwise negative except as noted above.   Physical Exam: VS:  BP 140/88 mmHg  Pulse 74  Ht 5\' 10"  (1.778 m)  Wt 176 lb 9.6 oz (80.105 kg)  BMI 25.34 kg/m2  SpO2 97% , BMI Body mass index is 25.34 kg/(m^2).  GEN- The patient is elderly appearing, alert and oriented x 3 today.   HEENT: normocephalic, atraumatic; sclera clear, conjunctiva pink; hearing intact; oropharynx clear; neck supple, no JVP Lymph- no cervical lymphadenopathy Lungs- Clear to ausculation bilaterally, normal work of breathing.  No wheezes, rales, rhonchi Heart- Regular rate and rhythm, pectus excavatum GI- soft, non-tender, non-distended, bowel sounds present  Extremities- no clubbing, cyanosis, or edema; DP/PT/radial  pulses 1+ bilaterally MS- no significant deformity or atrophy Skin- warm and dry, no rash or lesion; PPM pocket well healed, some flatulence over pocket, but no signs of infection Psych- euthymic mood, full affect Neuro- strength and sensation are intact  PPM Interrogation- reviewed in detail today,  See PACEART report  Recent Labs: No results found for requested labs within last 365 days.   Wt Readings from Last 3 Encounters:  03/25/15 176 lb 9.6 oz (80.105 kg)  01/07/15 176 lb 6.4 oz (80.015 kg)     Other studies Reviewed: Additional studies/ records that were reviewed today include: PCP's records  Assessment and Plan:  1.  Symptomatic bradycardia  Normal PPM function, wound well healed See  Pace Art report Outputs programmed per office protocol  Will follow remotely Return in 1 year to see EP NP  Current medicines are reviewed at length with the patient today.   The patient does not have concerns regarding his medicines.  The following changes were made today:  none  Labs/ tests ordered today include:  Orders Placed This Encounter  Procedures  . Implantable device check     Signed, [place] [address] [place] [address] [phone] (office) [phone] (fax)

## 2015-04-24 ENCOUNTER — Encounter: Payer: Self-pay | Admitting: Internal Medicine

## 2015-06-24 ENCOUNTER — Encounter: Payer: Medicare Other | Admitting: *Deleted

## 2015-06-26 ENCOUNTER — Encounter: Payer: Self-pay | Admitting: Cardiology

## 2015-07-01 ENCOUNTER — Ambulatory Visit (INDEPENDENT_AMBULATORY_CARE_PROVIDER_SITE_OTHER): Payer: Medicare Other | Admitting: *Deleted

## 2015-07-01 DIAGNOSIS — I495 Sick sinus syndrome: Secondary | ICD-10-CM | POA: Diagnosis not present

## 2015-07-03 NOTE — Progress Notes (Signed)
Remote pacemaker transmission.   

## 2015-07-09 LAB — CUP PACEART REMOTE DEVICE CHECK
Battery Remaining Longevity: 113 mo
Brady Statistic AP VS Percent: 78.02 %
Brady Statistic RV Percent Paced: 4.66 %
Implantable Lead Location: 753860
Implantable Lead Model: 5076
Lead Channel Impedance Value: 304 Ohm
Lead Channel Impedance Value: 570 Ohm
Lead Channel Pacing Threshold Pulse Width: 0.4 ms
Lead Channel Pacing Threshold Pulse Width: 0.4 ms
Lead Channel Sensing Intrinsic Amplitude: 2.875 mV
Lead Channel Sensing Intrinsic Amplitude: 2.875 mV
Lead Channel Sensing Intrinsic Amplitude: 6.875 mV
Lead Channel Setting Pacing Amplitude: 2.5 V
Lead Channel Setting Sensing Sensitivity: 0.9 mV
MDC IDC LEAD IMPLANT DT: 20160724
MDC IDC LEAD IMPLANT DT: 20160724
MDC IDC LEAD LOCATION: 753859
MDC IDC MSMT BATTERY VOLTAGE: 3.03 V
MDC IDC MSMT LEADCHNL RA IMPEDANCE VALUE: 361 Ohm
MDC IDC MSMT LEADCHNL RA PACING THRESHOLD AMPLITUDE: 0.5 V
MDC IDC MSMT LEADCHNL RV IMPEDANCE VALUE: 437 Ohm
MDC IDC MSMT LEADCHNL RV PACING THRESHOLD AMPLITUDE: 0.625 V
MDC IDC MSMT LEADCHNL RV SENSING INTR AMPL: 6.875 mV
MDC IDC SESS DTM: 20170130182020
MDC IDC SET LEADCHNL RA PACING AMPLITUDE: 2 V
MDC IDC SET LEADCHNL RV PACING PULSEWIDTH: 0.4 ms
MDC IDC STAT BRADY AP VP PERCENT: 4.06 %
MDC IDC STAT BRADY AS VP PERCENT: 0.6 %
MDC IDC STAT BRADY AS VS PERCENT: 17.32 %
MDC IDC STAT BRADY RA PERCENT PACED: 82.07 %

## 2015-07-12 ENCOUNTER — Encounter: Payer: Self-pay | Admitting: Cardiology

## 2015-09-30 ENCOUNTER — Ambulatory Visit (INDEPENDENT_AMBULATORY_CARE_PROVIDER_SITE_OTHER): Payer: Medicare Other | Admitting: *Deleted

## 2015-09-30 DIAGNOSIS — I495 Sick sinus syndrome: Secondary | ICD-10-CM | POA: Diagnosis not present

## 2015-09-30 NOTE — Progress Notes (Signed)
Remote pacemaker transmission.   

## 2015-11-08 ENCOUNTER — Encounter: Payer: Self-pay | Admitting: Cardiology

## 2015-11-08 LAB — CUP PACEART REMOTE DEVICE CHECK
Battery Remaining Longevity: 108 mo
Battery Voltage: 3.02 V
Brady Statistic AP VP Percent: 4.24 %
Brady Statistic AS VP Percent: 0.43 %
Brady Statistic AS VS Percent: 16.15 %
Brady Statistic RV Percent Paced: 4.67 %
Implantable Lead Implant Date: 20160724
Implantable Lead Implant Date: 20160724
Implantable Lead Location: 753859
Implantable Lead Model: 5076
Lead Channel Impedance Value: 342 Ohm
Lead Channel Impedance Value: 418 Ohm
Lead Channel Impedance Value: 551 Ohm
Lead Channel Pacing Threshold Amplitude: 0.5 V
Lead Channel Pacing Threshold Amplitude: 0.625 V
Lead Channel Sensing Intrinsic Amplitude: 6.625 mV
Lead Channel Setting Pacing Amplitude: 2 V
Lead Channel Setting Pacing Amplitude: 2.5 V
Lead Channel Setting Pacing Pulse Width: 0.4 ms
Lead Channel Setting Sensing Sensitivity: 0.9 mV
MDC IDC LEAD LOCATION: 753860
MDC IDC MSMT LEADCHNL RA IMPEDANCE VALUE: 304 Ohm
MDC IDC MSMT LEADCHNL RA PACING THRESHOLD PULSEWIDTH: 0.4 ms
MDC IDC MSMT LEADCHNL RA SENSING INTR AMPL: 2.75 mV
MDC IDC MSMT LEADCHNL RA SENSING INTR AMPL: 2.75 mV
MDC IDC MSMT LEADCHNL RV PACING THRESHOLD PULSEWIDTH: 0.4 ms
MDC IDC MSMT LEADCHNL RV SENSING INTR AMPL: 6.625 mV
MDC IDC SESS DTM: 20170501121239
MDC IDC STAT BRADY AP VS PERCENT: 79.18 %
MDC IDC STAT BRADY RA PERCENT PACED: 83.42 %

## 2015-12-30 ENCOUNTER — Telehealth: Payer: Self-pay | Admitting: Cardiology

## 2015-12-30 ENCOUNTER — Ambulatory Visit: Payer: Medicare Other | Admitting: *Deleted

## 2015-12-30 NOTE — Telephone Encounter (Signed)
Spoke with pt and reminded pt of remote transmission that is due today. Pt verbalized understanding.   

## 2015-12-30 NOTE — Progress Notes (Deleted)
Carelink Summary Report / Loop Recorder 

## 2016-01-03 ENCOUNTER — Encounter: Payer: Self-pay | Admitting: Cardiology

## 2016-01-10 ENCOUNTER — Ambulatory Visit (INDEPENDENT_AMBULATORY_CARE_PROVIDER_SITE_OTHER): Payer: Medicare Other | Admitting: *Deleted

## 2016-01-10 DIAGNOSIS — R001 Bradycardia, unspecified: Secondary | ICD-10-CM

## 2016-01-10 DIAGNOSIS — I495 Sick sinus syndrome: Secondary | ICD-10-CM

## 2016-01-14 ENCOUNTER — Encounter: Payer: Self-pay | Admitting: Cardiology

## 2016-01-14 NOTE — Progress Notes (Signed)
Remote pacemaker transmission.   

## 2016-01-15 LAB — CUP PACEART REMOTE DEVICE CHECK
Battery Remaining Longevity: 108 mo
Battery Voltage: 3.02 V
Brady Statistic AS VS Percent: 18.39 %
Implantable Lead Implant Date: 20160724
Implantable Lead Location: 753859
Implantable Lead Location: 753860
Lead Channel Impedance Value: 304 Ohm
Lead Channel Impedance Value: 342 Ohm
Lead Channel Pacing Threshold Amplitude: 0.5 V
Lead Channel Pacing Threshold Amplitude: 0.75 V
Lead Channel Pacing Threshold Pulse Width: 0.4 ms
Lead Channel Pacing Threshold Pulse Width: 0.4 ms
Lead Channel Sensing Intrinsic Amplitude: 2.875 mV
Lead Channel Setting Pacing Amplitude: 2 V
MDC IDC LEAD IMPLANT DT: 20160724
MDC IDC MSMT LEADCHNL RA SENSING INTR AMPL: 2.875 mV
MDC IDC MSMT LEADCHNL RV IMPEDANCE VALUE: 399 Ohm
MDC IDC MSMT LEADCHNL RV IMPEDANCE VALUE: 551 Ohm
MDC IDC MSMT LEADCHNL RV SENSING INTR AMPL: 5.75 mV
MDC IDC MSMT LEADCHNL RV SENSING INTR AMPL: 5.75 mV
MDC IDC SESS DTM: 20170811173845
MDC IDC SET LEADCHNL RV PACING AMPLITUDE: 2.5 V
MDC IDC SET LEADCHNL RV PACING PULSEWIDTH: 0.4 ms
MDC IDC SET LEADCHNL RV SENSING SENSITIVITY: 0.9 mV
MDC IDC STAT BRADY AP VP PERCENT: 14.28 %
MDC IDC STAT BRADY AP VS PERCENT: 63.52 %
MDC IDC STAT BRADY AS VP PERCENT: 3.81 %
MDC IDC STAT BRADY RA PERCENT PACED: 77.81 %
MDC IDC STAT BRADY RV PERCENT PACED: 18.09 %

## 2016-04-08 ENCOUNTER — Encounter: Payer: Medicare Other | Admitting: Nurse Practitioner

## 2016-04-19 NOTE — Progress Notes (Signed)
Electrophysiology Office Note Date: 04/20/2016  ID:  [name], DOB 09-Nov-1924, MRN [id number]  PCP: Jerlyn Ly, MD Electrophysiologist: Rayann Heman    CC: routine pacemaker follow up  [name] is a 82 y.o. male seen today for Dr Rayann Heman.  He presents today for routine EP follow up. Since last being seen in clinic, he reports doing very well.  He remains very active with daily exercises and mowing his lawn.  He denies chest pain, palpitations, dyspnea, PND, orthopnea, nausea, vomiting, dizziness, syncope.  Device History: MDT dual chamber PPM implanted 2016 for SSS   Past Medical History:  Diagnosis Date  . Macular degeneration   . Osteoporosis   . Sick sinus syndrome [facility])    a. s/p MDT dual chamber PPM implant 11/2014   Past Surgical History:  Procedure Laterality Date  . PACEMAKER INSERTION  11/2014   MDT dual chamber PPM implanted for SSS     Current Outpatient Prescriptions  Medication Sig Dispense Refill  . Ascorbic Acid (VITAMIN C) 1000 MG tablet Take 1,000 mg by mouth daily.    . cholecalciferol (VITAMIN D) 1000 UNITS tablet Take 1,000 Units by mouth daily.    . cyanocobalamin 1000 MCG tablet Take 100 mcg by mouth daily.    Marland Kitchen OVER THE COUNTER MEDICATION Take 1 tablet by mouth for eyes    . zolpidem (AMBIEN) 10 MG tablet Take 5 mg by mouth at bedtime as needed for sleep.   0   No current facility-administered medications for this visit.     Allergies:   Patient has no known allergies.   Social History: Social History   Social History  . Marital status: Married    Spouse name: N/A  . Number of children: N/A  . Years of education: N/A   Occupational History  . Not on file.   Social History Main Topics  . Smoking status: Never Smoker  . Smokeless tobacco: Never Used  . Alcohol use Not on file  . Drug use: Unknown  . Sexual activity: Not on file   Other Topics Concern  . Not on file   Social History Narrative  . No narrative on file     Family History: Family History  Problem Relation Age of Onset  . Other Mother     OLD AGE  . Stroke Father   . Heart attack Brother   . Cancer Brother   . Cancer Brother   . Healthy Son   . Healthy Daughter      Review of Systems: All other systems reviewed and are otherwise negative except as noted above.   Physical Exam: VS:  BP 140/72   Pulse 77   Ht 5\' 10"  (1.778 m)   Wt 178 lb 1.9 oz (80.8 kg)   SpO2 98%   BMI 25.56 kg/m  , BMI Body mass index is 25.56 kg/m.  GEN- The patient is elderly appearing, alert and oriented x 3 today.   HEENT: normocephalic, atraumatic; sclera clear, conjunctiva pink; hearing intact; oropharynx clear; neck supple Lungs- Clear to ausculation bilaterally, normal work of breathing.  No wheezes, rales, rhonchi Heart- Regular rate and rhythm, pectus excavatum GI- soft, non-tender, non-distended, bowel sounds present  Extremities- no clubbing, cyanosis, or edema; DP/PT/radial pulses 1+ bilaterally MS- no significant deformity or atrophy Skin- warm and dry, no rash or lesion; PPM pocket well healed, some flatulence over pocket, but no signs of infection Psych- euthymic mood, full affect Neuro- strength and sensation are  intact  PPM Interrogation- reviewed in detail today,  See PACEART report  EKG:  EKG is ordered today. The ekg ordered today shows atrial pacing with intrinsic ventricular conduction  Recent Labs: No results found for requested labs within last 8760 hours.   Wt Readings from Last 3 Encounters:  04/20/16 178 lb 1.9 oz (80.8 kg)  03/25/15 176 lb 9.6 oz (80.1 kg)  01/07/15 176 lb 6.4 oz (80 kg)     Other studies Reviewed: Additional studies/ records that were reviewed today include: Dr Jackalyn Lombard notes  Assessment and Plan:  1.  Symptomatic bradycardia  Normal PPM function See Pace Art report No changes today   Current medicines are reviewed at length with the patient today.   The patient does not have concerns  regarding his medicines.  The following changes were made today:  none  Labs/ tests ordered today include: none Orders Placed This Encounter  Procedures  . EKG 12-Lead     Disposition:   Follow up with [facility], Dr Rayann Heman 1 year   Signed, Chanetta Marshall, NP 04/20/2016 2:41 PM  [place] [address] [place] [address] [phone] (office) [phone] (fax)

## 2016-04-20 ENCOUNTER — Encounter: Payer: Self-pay | Admitting: Nurse Practitioner

## 2016-04-20 ENCOUNTER — Ambulatory Visit (INDEPENDENT_AMBULATORY_CARE_PROVIDER_SITE_OTHER): Payer: Medicare Other | Admitting: Nurse Practitioner

## 2016-04-20 VITALS — BP 140/72 | HR 77 | Ht 70.0 in | Wt 178.1 lb

## 2016-04-20 DIAGNOSIS — R001 Bradycardia, unspecified: Secondary | ICD-10-CM | POA: Diagnosis not present

## 2016-04-20 LAB — CUP PACEART INCLINIC DEVICE CHECK
Implantable Lead Implant Date: 20160724
Implantable Lead Implant Date: 20160724
Implantable Lead Location: 753860
Implantable Lead Model: 5076
Implantable Lead Model: 5076
Implantable Pulse Generator Implant Date: 20160724
MDC IDC LEAD LOCATION: 753859
MDC IDC SESS DTM: 20171120144513

## 2016-04-20 NOTE — Patient Instructions (Addendum)
Medication Instructions:   Your physician recommends that you continue on your current medications as directed. Please refer to the Current Medication list given to you today.   If you need a refill on your cardiac medications before your next appointment, please call your pharmacy.  Labwork: NONE ORDERED  TODAY    Testing/Procedures: NONE ORDERED  TODAY    Follow-Up: Your physician wants you to follow-up in: [place].Marland Kitchen You will receive a reminder letter in the mail two months in advance. If you don't receive a letter, please call our office to schedule the follow-up appointment.  Remote monitoring is used to monitor your Pacemaker of ICD from home. This monitoring reduces the number of office visits required to check your device to one time per year. It allows [place] to keep an eye on the functioning of your device to ensure it is working properly. You are scheduled for a device check from home on..07/20/16..You may send your transmission at any time that day. If you have a wireless device, the transmission will be sent automatically. After your physician reviews your transmission, you will receive a postcard with your next transmission date.     Any Other Special Instructions Will Be Listed Below (If Applicable).

## 2016-07-20 ENCOUNTER — Ambulatory Visit (INDEPENDENT_AMBULATORY_CARE_PROVIDER_SITE_OTHER): Payer: Medicare Other | Admitting: *Deleted

## 2016-07-20 DIAGNOSIS — I495 Sick sinus syndrome: Secondary | ICD-10-CM

## 2016-07-21 ENCOUNTER — Encounter: Payer: Self-pay | Admitting: Cardiology

## 2016-07-21 LAB — CUP PACEART REMOTE DEVICE CHECK
Battery Voltage: 3.01 V
Brady Statistic AP VP Percent: 23.27 %
Brady Statistic AS VP Percent: 4.84 %
Brady Statistic AS VS Percent: 12.17 %
Brady Statistic RA Percent Paced: 79.9 %
Brady Statistic RV Percent Paced: 27.59 %
Implantable Lead Implant Date: 20160724
Implantable Lead Model: 5076
Implantable Lead Model: 5076
Implantable Pulse Generator Implant Date: 20160724
Lead Channel Impedance Value: 285 Ohm
Lead Channel Impedance Value: 323 Ohm
Lead Channel Impedance Value: 399 Ohm
Lead Channel Impedance Value: 494 Ohm
Lead Channel Pacing Threshold Amplitude: 0.625 V
Lead Channel Pacing Threshold Pulse Width: 0.4 ms
Lead Channel Pacing Threshold Pulse Width: 0.4 ms
Lead Channel Sensing Intrinsic Amplitude: 5.5 mV
Lead Channel Setting Pacing Amplitude: 2 V
Lead Channel Setting Pacing Pulse Width: 0.4 ms
Lead Channel Setting Sensing Sensitivity: 0.9 mV
MDC IDC LEAD IMPLANT DT: 20160724
MDC IDC LEAD LOCATION: 753859
MDC IDC LEAD LOCATION: 753860
MDC IDC MSMT BATTERY REMAINING LONGEVITY: 94 mo
MDC IDC MSMT LEADCHNL RA PACING THRESHOLD AMPLITUDE: 0.5 V
MDC IDC MSMT LEADCHNL RA SENSING INTR AMPL: 2.75 mV
MDC IDC MSMT LEADCHNL RA SENSING INTR AMPL: 2.75 mV
MDC IDC MSMT LEADCHNL RV SENSING INTR AMPL: 5.5 mV
MDC IDC SESS DTM: 20180219183127
MDC IDC SET LEADCHNL RV PACING AMPLITUDE: 2.5 V
MDC IDC STAT BRADY AP VS PERCENT: 59.73 %

## 2016-07-21 NOTE — Progress Notes (Signed)
Remote pacemaker transmission.   

## 2016-10-19 ENCOUNTER — Ambulatory Visit (INDEPENDENT_AMBULATORY_CARE_PROVIDER_SITE_OTHER): Payer: Medicare Other | Admitting: *Deleted

## 2016-10-19 DIAGNOSIS — I495 Sick sinus syndrome: Secondary | ICD-10-CM

## 2016-10-19 NOTE — Progress Notes (Signed)
Remote pacemaker transmission.   

## 2016-10-20 LAB — CUP PACEART REMOTE DEVICE CHECK
Battery Remaining Longevity: 95 mo
Brady Statistic AP VS Percent: 59.78 %
Brady Statistic AS VP Percent: 4.28 %
Brady Statistic AS VS Percent: 9.36 %
Brady Statistic RV Percent Paced: 29.12 %
Implantable Lead Implant Date: 20160724
Implantable Lead Implant Date: 20160724
Implantable Lead Location: 753859
Implantable Lead Model: 5076
Implantable Lead Model: 5076
Implantable Pulse Generator Implant Date: 20160724
Lead Channel Impedance Value: 323 Ohm
Lead Channel Pacing Threshold Amplitude: 0.75 V
Lead Channel Sensing Intrinsic Amplitude: 1.875 mV
Lead Channel Sensing Intrinsic Amplitude: 1.875 mV
Lead Channel Sensing Intrinsic Amplitude: 5.625 mV
Lead Channel Sensing Intrinsic Amplitude: 5.625 mV
Lead Channel Setting Pacing Amplitude: 2 V
Lead Channel Setting Pacing Amplitude: 2.5 V
MDC IDC LEAD LOCATION: 753860
MDC IDC MSMT BATTERY VOLTAGE: 3.01 V
MDC IDC MSMT LEADCHNL RA IMPEDANCE VALUE: 266 Ohm
MDC IDC MSMT LEADCHNL RA PACING THRESHOLD AMPLITUDE: 0.5 V
MDC IDC MSMT LEADCHNL RA PACING THRESHOLD PULSEWIDTH: 0.4 ms
MDC IDC MSMT LEADCHNL RV IMPEDANCE VALUE: 342 Ohm
MDC IDC MSMT LEADCHNL RV IMPEDANCE VALUE: 494 Ohm
MDC IDC MSMT LEADCHNL RV PACING THRESHOLD PULSEWIDTH: 0.4 ms
MDC IDC SESS DTM: 20180521152806
MDC IDC SET LEADCHNL RV PACING PULSEWIDTH: 0.4 ms
MDC IDC SET LEADCHNL RV SENSING SENSITIVITY: 0.9 mV
MDC IDC STAT BRADY AP VP PERCENT: 26.57 %
MDC IDC STAT BRADY RA PERCENT PACED: 78.09 %

## 2016-10-21 ENCOUNTER — Encounter: Payer: Self-pay | Admitting: Cardiology

## 2017-01-18 ENCOUNTER — Ambulatory Visit (INDEPENDENT_AMBULATORY_CARE_PROVIDER_SITE_OTHER): Payer: Medicare Other | Admitting: *Deleted

## 2017-01-18 DIAGNOSIS — I495 Sick sinus syndrome: Secondary | ICD-10-CM

## 2017-01-19 NOTE — Progress Notes (Signed)
Remote pacemaker transmission.   

## 2017-01-20 LAB — CUP PACEART REMOTE DEVICE CHECK
Battery Remaining Longevity: 93 mo
Battery Voltage: 3.01 V
Brady Statistic RA Percent Paced: 76.84 %
Brady Statistic RV Percent Paced: 6.15 %
Date Time Interrogation Session: 20180821174302
Implantable Lead Implant Date: 20160724
Implantable Lead Implant Date: 20160724
Implantable Lead Location: 753859
Implantable Lead Model: 5076
Implantable Pulse Generator Implant Date: 20160724
Lead Channel Impedance Value: 380 Ohm
Lead Channel Impedance Value: 494 Ohm
Lead Channel Pacing Threshold Amplitude: 0.625 V
Lead Channel Sensing Intrinsic Amplitude: 2 mV
Lead Channel Setting Pacing Amplitude: 2 V
Lead Channel Setting Sensing Sensitivity: 0.9 mV
MDC IDC LEAD LOCATION: 753860
MDC IDC MSMT LEADCHNL RA IMPEDANCE VALUE: 285 Ohm
MDC IDC MSMT LEADCHNL RA IMPEDANCE VALUE: 323 Ohm
MDC IDC MSMT LEADCHNL RA PACING THRESHOLD PULSEWIDTH: 0.4 ms
MDC IDC MSMT LEADCHNL RA SENSING INTR AMPL: 2 mV
MDC IDC MSMT LEADCHNL RV PACING THRESHOLD AMPLITUDE: 0.625 V
MDC IDC MSMT LEADCHNL RV PACING THRESHOLD PULSEWIDTH: 0.4 ms
MDC IDC MSMT LEADCHNL RV SENSING INTR AMPL: 5.625 mV
MDC IDC MSMT LEADCHNL RV SENSING INTR AMPL: 5.625 mV
MDC IDC SET LEADCHNL RV PACING AMPLITUDE: 2.5 V
MDC IDC SET LEADCHNL RV PACING PULSEWIDTH: 0.4 ms
MDC IDC STAT BRADY AP VP PERCENT: 5.43 %
MDC IDC STAT BRADY AP VS PERCENT: 77.04 %
MDC IDC STAT BRADY AS VP PERCENT: 0.42 %
MDC IDC STAT BRADY AS VS PERCENT: 17.11 %

## 2017-01-29 ENCOUNTER — Encounter: Payer: Self-pay | Admitting: Cardiology

## 2017-04-19 ENCOUNTER — Telehealth: Payer: Self-pay | Admitting: Cardiology

## 2017-04-19 ENCOUNTER — Ambulatory Visit (INDEPENDENT_AMBULATORY_CARE_PROVIDER_SITE_OTHER): Payer: Medicare Other | Admitting: *Deleted

## 2017-04-19 DIAGNOSIS — I495 Sick sinus syndrome: Secondary | ICD-10-CM | POA: Diagnosis not present

## 2017-04-19 NOTE — Telephone Encounter (Signed)
LMOVM reminding pt to send remote transmission.   

## 2017-04-20 NOTE — Progress Notes (Signed)
Remote pacemaker transmission.   

## 2017-04-21 LAB — CUP PACEART REMOTE DEVICE CHECK
Battery Remaining Longevity: 93 mo
Battery Voltage: 3.01 V
Brady Statistic AS VP Percent: 1.13 %
Brady Statistic RA Percent Paced: 79.08 %
Brady Statistic RV Percent Paced: 10.54 %
Date Time Interrogation Session: 20181119222446
Implantable Lead Implant Date: 20160724
Implantable Lead Implant Date: 20160724
Implantable Lead Location: 753859
Implantable Lead Location: 753860
Implantable Lead Model: 5076
Implantable Pulse Generator Implant Date: 20160724
Lead Channel Impedance Value: 418 Ohm
Lead Channel Impedance Value: 513 Ohm
Lead Channel Pacing Threshold Amplitude: 0.75 V
Lead Channel Sensing Intrinsic Amplitude: 2.5 mV
Lead Channel Setting Pacing Amplitude: 2 V
Lead Channel Setting Pacing Amplitude: 2.5 V
Lead Channel Setting Sensing Sensitivity: 0.9 mV
MDC IDC MSMT LEADCHNL RA IMPEDANCE VALUE: 285 Ohm
MDC IDC MSMT LEADCHNL RA IMPEDANCE VALUE: 323 Ohm
MDC IDC MSMT LEADCHNL RA PACING THRESHOLD PULSEWIDTH: 0.4 ms
MDC IDC MSMT LEADCHNL RA SENSING INTR AMPL: 2.5 mV
MDC IDC MSMT LEADCHNL RV PACING THRESHOLD AMPLITUDE: 0.625 V
MDC IDC MSMT LEADCHNL RV PACING THRESHOLD PULSEWIDTH: 0.4 ms
MDC IDC MSMT LEADCHNL RV SENSING INTR AMPL: 6 mV
MDC IDC MSMT LEADCHNL RV SENSING INTR AMPL: 6 mV
MDC IDC SET LEADCHNL RV PACING PULSEWIDTH: 0.4 ms
MDC IDC STAT BRADY AP VP PERCENT: 9.14 %
MDC IDC STAT BRADY AP VS PERCENT: 72.25 %
MDC IDC STAT BRADY AS VS PERCENT: 17.48 %

## 2017-04-29 ENCOUNTER — Encounter: Payer: Self-pay | Admitting: Cardiology

## 2017-05-03 ENCOUNTER — Encounter: Payer: Self-pay | Admitting: Internal Medicine

## 2017-05-03 ENCOUNTER — Ambulatory Visit: Payer: Medicare Other | Admitting: Internal Medicine

## 2017-05-03 VITALS — BP 128/80 | HR 64 | Ht 71.0 in | Wt 177.8 lb

## 2017-05-03 DIAGNOSIS — I495 Sick sinus syndrome: Secondary | ICD-10-CM | POA: Diagnosis not present

## 2017-05-03 LAB — CUP PACEART INCLINIC DEVICE CHECK
Battery Remaining Longevity: 90 mo
Brady Statistic AP VS Percent: 67.54 %
Brady Statistic AS VP Percent: 2.6 %
Brady Statistic AS VS Percent: 14.08 %
Brady Statistic RV Percent Paced: 17.95 %
Implantable Lead Implant Date: 20160724
Implantable Lead Location: 753860
Implantable Lead Model: 5076
Lead Channel Impedance Value: 285 Ohm
Lead Channel Impedance Value: 323 Ohm
Lead Channel Pacing Threshold Amplitude: 0.625 V
Lead Channel Pacing Threshold Amplitude: 0.75 V
Lead Channel Pacing Threshold Pulse Width: 0.4 ms
Lead Channel Sensing Intrinsic Amplitude: 1.625 mV
Lead Channel Sensing Intrinsic Amplitude: 2.875 mV
Lead Channel Sensing Intrinsic Amplitude: 5.75 mV
Lead Channel Setting Pacing Amplitude: 2.5 V
Lead Channel Setting Sensing Sensitivity: 0.9 mV
MDC IDC LEAD IMPLANT DT: 20160724
MDC IDC LEAD LOCATION: 753859
MDC IDC MSMT BATTERY VOLTAGE: 3.01 V
MDC IDC MSMT LEADCHNL RV IMPEDANCE VALUE: 399 Ohm
MDC IDC MSMT LEADCHNL RV IMPEDANCE VALUE: 513 Ohm
MDC IDC MSMT LEADCHNL RV PACING THRESHOLD PULSEWIDTH: 0.4 ms
MDC IDC MSMT LEADCHNL RV SENSING INTR AMPL: 7.75 mV
MDC IDC PG IMPLANT DT: 20160724
MDC IDC SESS DTM: 20181203150826
MDC IDC SET LEADCHNL RA PACING AMPLITUDE: 2 V
MDC IDC SET LEADCHNL RV PACING PULSEWIDTH: 0.4 ms
MDC IDC STAT BRADY AP VP PERCENT: 15.77 %
MDC IDC STAT BRADY RA PERCENT PACED: 78.59 %

## 2017-05-03 NOTE — Patient Instructions (Signed)
Medication Instructions:  Your physician recommends that you continue on your current medications as directed. Please refer to the Current Medication list given to you today.    Labwork: None ordered   Testing/Procedures: None ordered   Follow-Up: Your physician wants you to follow-up in: 12 months with Chanetta Marshall, NP You will receive a reminder letter in the mail two months in advance. If you don't receive a letter, please call our office to schedule the follow-up appointment.   Remote monitoring is used to monitor your ICD from home. This monitoring reduces the number of office visits required to check your device to one time per year. It allows [place] to keep an eye on the functioning of your device to ensure it is working properly. You are scheduled for a device check from home on 07/19/17. You may send your transmission at any time that day. If you have a wireless device, the transmission will be sent automatically. After your physician reviews your transmission, you will receive a postcard with your next transmission date.    Any Other Special Instructions Will Be Listed Below (If Applicable).     If you need a refill on your cardiac medications before your next appointment, please call your pharmacy.

## 2017-05-03 NOTE — Progress Notes (Signed)
    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  Since last being seen in our clinic, the patient reports doing very well.  He remains active for his age.  continues to do his own house work and yard work.  He lives alone and has several acres of land.  Today, he denies symptoms of palpitations, chest pain, shortness of breath,  lower extremity edema, dizziness, presyncope, or syncope.  The patient is otherwise without complaint today.   Past Medical History:  Diagnosis Date  . Macular degeneration   . Osteoporosis   . Sick sinus syndrome [facility])    a. s/p MDT dual chamber PPM implant 11/2014   Past Surgical History:  Procedure Laterality Date  . PACEMAKER INSERTION  11/2014   MDT dual chamber PPM implanted for SSS     ROS- all systems are reviewed and negative except as per HPI above  Current Outpatient Medications  Medication Sig Dispense Refill  . Ascorbic Acid (VITAMIN C) 1000 MG tablet Take 1,000 mg by mouth daily.    . cholecalciferol (VITAMIN D) 1000 UNITS tablet Take 1,000 Units by mouth daily.    . cyanocobalamin 1000 MCG tablet Take 100 mcg by mouth daily.    Marland Kitchen OVER THE COUNTER MEDICATION Take 1 tablet by mouth daily (OTC supplement for eyes)    . zolpidem (AMBIEN) 10 MG tablet Take 5 mg by mouth at bedtime as needed for sleep.   0   No current facility-administered medications for this visit.     Physical Exam: Vitals:   05/03/17 1419  BP: 128/80  Pulse: 64  SpO2: 95%  Weight: 177 lb 12.8 oz (80.6 kg)  Height: 5\' 11"  (1.803 m)    GEN- The patient is well appearing, alert and oriented x 3 today.   Head- normocephalic, atraumatic Eyes-  Sclera clear, conjunctiva pink Ears- hearing intact Oropharynx- clear Lungs- Clear to ausculation bilaterally, normal work of breathing Chest- pacemaker pocket is well healed Heart- Regular rate and rhythm, no murmurs, rubs or  gallops, PMI not laterally displaced GI- soft, NT, ND, + BS Extremities- no clubbing, cyanosis, or edema  Pacemaker interrogation- reviewed in detail today,  See PACEART report  ekg tracing ordered today is personally reviewed and shows A paced rhythm, first degree AV block  Assessment and Plan:  1. Symptomatic sinus bradycardia  Normal pacemaker function See Pace Art report No changes today  carelink Return to see EP NP every year   Thompson Grayer MD, [facility] 05/03/2017 2:26 PM

## 2017-07-19 ENCOUNTER — Ambulatory Visit (INDEPENDENT_AMBULATORY_CARE_PROVIDER_SITE_OTHER): Payer: Medicare Other | Admitting: *Deleted

## 2017-07-19 DIAGNOSIS — I495 Sick sinus syndrome: Secondary | ICD-10-CM | POA: Diagnosis not present

## 2017-07-20 LAB — CUP PACEART REMOTE DEVICE CHECK
Battery Voltage: 3.01 V
Brady Statistic AS VP Percent: 0.71 %
Brady Statistic RA Percent Paced: 82.45 %
Date Time Interrogation Session: 20190218154418
Implantable Lead Implant Date: 20160724
Implantable Lead Location: 753859
Implantable Lead Location: 753860
Implantable Lead Model: 5076
Implantable Pulse Generator Implant Date: 20160724
Lead Channel Impedance Value: 285 Ohm
Lead Channel Pacing Threshold Pulse Width: 0.4 ms
Lead Channel Pacing Threshold Pulse Width: 0.4 ms
Lead Channel Sensing Intrinsic Amplitude: 2 mV
Lead Channel Sensing Intrinsic Amplitude: 5.875 mV
Lead Channel Setting Pacing Amplitude: 2 V
Lead Channel Setting Pacing Pulse Width: 0.4 ms
MDC IDC LEAD IMPLANT DT: 20160724
MDC IDC MSMT BATTERY REMAINING LONGEVITY: 89 mo
MDC IDC MSMT LEADCHNL RA IMPEDANCE VALUE: 342 Ohm
MDC IDC MSMT LEADCHNL RA PACING THRESHOLD AMPLITUDE: 0.5 V
MDC IDC MSMT LEADCHNL RA SENSING INTR AMPL: 2 mV
MDC IDC MSMT LEADCHNL RV IMPEDANCE VALUE: 399 Ohm
MDC IDC MSMT LEADCHNL RV IMPEDANCE VALUE: 513 Ohm
MDC IDC MSMT LEADCHNL RV PACING THRESHOLD AMPLITUDE: 0.5 V
MDC IDC MSMT LEADCHNL RV SENSING INTR AMPL: 5.875 mV
MDC IDC SET LEADCHNL RV PACING AMPLITUDE: 2.5 V
MDC IDC SET LEADCHNL RV SENSING SENSITIVITY: 0.9 mV
MDC IDC STAT BRADY AP VP PERCENT: 5.4 %
MDC IDC STAT BRADY AP VS PERCENT: 77.8 %
MDC IDC STAT BRADY AS VS PERCENT: 16.09 %
MDC IDC STAT BRADY RV PERCENT PACED: 6.39 %

## 2017-07-20 NOTE — Progress Notes (Signed)
Remote pacemaker transmission.   

## 2017-07-22 ENCOUNTER — Encounter: Payer: Self-pay | Admitting: Cardiology

## 2017-10-18 ENCOUNTER — Ambulatory Visit (INDEPENDENT_AMBULATORY_CARE_PROVIDER_SITE_OTHER): Payer: Medicare Other | Admitting: *Deleted

## 2017-10-18 ENCOUNTER — Telehealth: Payer: Self-pay | Admitting: Cardiology

## 2017-10-18 DIAGNOSIS — I495 Sick sinus syndrome: Secondary | ICD-10-CM | POA: Diagnosis not present

## 2017-10-18 NOTE — Telephone Encounter (Signed)
Spoke with pt and reminded pt of remote transmission that is due today. Pt verbalized understanding.   

## 2017-10-19 ENCOUNTER — Encounter: Payer: Self-pay | Admitting: Cardiology

## 2017-10-19 NOTE — Progress Notes (Signed)
Remote pacemaker transmission.   

## 2017-10-20 LAB — CUP PACEART REMOTE DEVICE CHECK
Battery Voltage: 3.01 V
Brady Statistic AP VP Percent: 35.13 %
Brady Statistic AP VS Percent: 49 %
Brady Statistic AS VP Percent: 6.15 %
Brady Statistic AS VS Percent: 9.72 %
Implantable Lead Implant Date: 20160724
Implantable Lead Location: 753859
Implantable Lead Model: 5076
Implantable Pulse Generator Implant Date: 20160724
Lead Channel Impedance Value: 285 Ohm
Lead Channel Impedance Value: 323 Ohm
Lead Channel Impedance Value: 380 Ohm
Lead Channel Pacing Threshold Amplitude: 0.625 V
Lead Channel Pacing Threshold Amplitude: 0.625 V
Lead Channel Pacing Threshold Pulse Width: 0.4 ms
Lead Channel Sensing Intrinsic Amplitude: 1.75 mV
Lead Channel Sensing Intrinsic Amplitude: 7.125 mV
Lead Channel Setting Pacing Amplitude: 2 V
Lead Channel Setting Pacing Amplitude: 2.5 V
Lead Channel Setting Sensing Sensitivity: 0.9 mV
MDC IDC LEAD IMPLANT DT: 20160724
MDC IDC LEAD LOCATION: 753860
MDC IDC MSMT BATTERY REMAINING LONGEVITY: 82 mo
MDC IDC MSMT LEADCHNL RA PACING THRESHOLD PULSEWIDTH: 0.4 ms
MDC IDC MSMT LEADCHNL RA SENSING INTR AMPL: 1.75 mV
MDC IDC MSMT LEADCHNL RV IMPEDANCE VALUE: 494 Ohm
MDC IDC MSMT LEADCHNL RV SENSING INTR AMPL: 7.125 mV
MDC IDC SESS DTM: 20190520194948
MDC IDC SET LEADCHNL RV PACING PULSEWIDTH: 0.4 ms
MDC IDC STAT BRADY RA PERCENT PACED: 82.35 %
MDC IDC STAT BRADY RV PERCENT PACED: 41.23 %

## 2018-01-17 ENCOUNTER — Ambulatory Visit (INDEPENDENT_AMBULATORY_CARE_PROVIDER_SITE_OTHER): Payer: Medicare Other | Admitting: *Deleted

## 2018-01-17 DIAGNOSIS — I495 Sick sinus syndrome: Secondary | ICD-10-CM

## 2018-01-18 ENCOUNTER — Telehealth: Payer: Self-pay | Admitting: Cardiology

## 2018-01-18 NOTE — Telephone Encounter (Signed)
LMOVM reminding pt to send remote transmission.   

## 2018-01-19 ENCOUNTER — Encounter: Payer: Self-pay | Admitting: Cardiology

## 2018-01-19 NOTE — Progress Notes (Signed)
Remote pacemaker transmission.   

## 2018-02-11 LAB — CUP PACEART REMOTE DEVICE CHECK
Battery Remaining Longevity: 85 mo
Brady Statistic AP VP Percent: 23.13 %
Brady Statistic AP VS Percent: 58.53 %
Brady Statistic AS VS Percent: 14.68 %
Brady Statistic RV Percent Paced: 26.91 %
Implantable Lead Implant Date: 20160724
Implantable Lead Location: 753859
Implantable Lead Model: 5076
Implantable Lead Model: 5076
Lead Channel Impedance Value: 342 Ohm
Lead Channel Impedance Value: 361 Ohm
Lead Channel Impedance Value: 513 Ohm
Lead Channel Pacing Threshold Amplitude: 0.875 V
Lead Channel Pacing Threshold Pulse Width: 0.4 ms
Lead Channel Sensing Intrinsic Amplitude: 1.875 mV
Lead Channel Sensing Intrinsic Amplitude: 6.375 mV
Lead Channel Setting Pacing Amplitude: 2.5 V
Lead Channel Setting Pacing Pulse Width: 0.4 ms
Lead Channel Setting Sensing Sensitivity: 0.9 mV
MDC IDC LEAD IMPLANT DT: 20160724
MDC IDC LEAD LOCATION: 753860
MDC IDC MSMT BATTERY VOLTAGE: 3.01 V
MDC IDC MSMT LEADCHNL RA IMPEDANCE VALUE: 285 Ohm
MDC IDC MSMT LEADCHNL RA PACING THRESHOLD AMPLITUDE: 0.75 V
MDC IDC MSMT LEADCHNL RA PACING THRESHOLD PULSEWIDTH: 0.4 ms
MDC IDC MSMT LEADCHNL RA SENSING INTR AMPL: 1.875 mV
MDC IDC MSMT LEADCHNL RV SENSING INTR AMPL: 6.375 mV
MDC IDC PG IMPLANT DT: 20160724
MDC IDC SESS DTM: 20190821131103
MDC IDC SET LEADCHNL RA PACING AMPLITUDE: 2 V
MDC IDC STAT BRADY AS VP PERCENT: 3.66 %
MDC IDC STAT BRADY RA PERCENT PACED: 79.31 %

## 2018-04-18 ENCOUNTER — Telehealth: Payer: Self-pay

## 2018-04-18 ENCOUNTER — Ambulatory Visit (INDEPENDENT_AMBULATORY_CARE_PROVIDER_SITE_OTHER): Payer: Medicare Other

## 2018-04-18 DIAGNOSIS — I495 Sick sinus syndrome: Secondary | ICD-10-CM | POA: Diagnosis not present

## 2018-04-18 DIAGNOSIS — R001 Bradycardia, unspecified: Secondary | ICD-10-CM

## 2018-04-18 NOTE — Telephone Encounter (Signed)
LMOVM reminding pt to send remote transmission.   

## 2018-04-19 NOTE — Progress Notes (Signed)
Remote pacemaker transmission.   

## 2018-04-21 ENCOUNTER — Encounter: Payer: Self-pay | Admitting: Cardiology

## 2018-05-15 NOTE — Progress Notes (Deleted)
Cardiology Office Note Date:  05/15/2018  Patient ID:  Reino, Lybbert 1925-03-20, MRN [id number] PCP:  Crist Infante, MD  Electrophysiologist:  Dr. Rayann Heman  ***refresh   Chief Complaint: annual EP visit  History of Present Illness: APRIL CARLYON is a 84 y.o. male with history of only of osteoporosis, macular degeneration, SSSx w/PPM.  She comes in today to be seen for Dr. Rayann Heman.  He last saw him 05/03/18, at that time doing quite well, remained very active, no changes weremade to his tx, planned for annual visits.  *** meds *** symptoms *** labs  Device information: MDT dual chamber PPM, implanted 12/23/14   Past Medical History:  Diagnosis Date  . Macular degeneration   . Osteoporosis   . Sick sinus syndrome [facility])    a. s/p MDT dual chamber PPM implant 11/2014    Past Surgical History:  Procedure Laterality Date  . PACEMAKER INSERTION  11/2014   MDT dual chamber PPM implanted for SSS     Current Outpatient Medications  Medication Sig Dispense Refill  . Ascorbic Acid (VITAMIN C) 1000 MG tablet Take 1,000 mg by mouth daily.    . cholecalciferol (VITAMIN D) 1000 UNITS tablet Take 1,000 Units by mouth daily.    . cyanocobalamin 1000 MCG tablet Take 100 mcg by mouth daily.    Marland Kitchen OVER THE COUNTER MEDICATION Take 1 tablet by mouth daily (OTC supplement for eyes)    . zolpidem (AMBIEN) 10 MG tablet Take 5 mg by mouth at bedtime as needed for sleep.   0   No current facility-administered medications for this visit.     Allergies:   Patient has no known allergies.   Social History:  The patient  reports that he has never smoked. He has never used smokeless tobacco.   Family History:  The patient's family history includes Cancer in his brother and brother; Healthy in his daughter and son; Heart attack in his brother; Other in his mother; Stroke in his father.  ROS:  Please see the history of present illness.   All other systems are reviewed and otherwise negative.     PHYSICAL EXAM: *** VS:  There were no vitals taken for this visit. BMI: There is no height or weight on file to calculate BMI. Well nourished, well developed, in no acute distress  HEENT: normocephalic, atraumatic  Neck: no JVD, carotid bruits or masses Cardiac:  *** RRR; no significant murmurs, no rubs, or gallops Lungs:  *** CTA b/l, no wheezing, rhonchi or rales  Abd: soft, nontender MS: no deformity or *** age appropriate atrophy Ext: *** no edema  Skin: warm and dry, no rash Neuro:  No gross deficits appreciated Psych: euthymic mood, full affect  *** PPM site is stable, no tethering or discomfort   EKG:  Done today and reviewed by myself *** PPM interrogation done today and reviewed by myself: ***  Review of record, notes from 2016, make mention of an echo done during the hospital stay with his PPM implant, reportedly was "normal"   Recent Labs: No results found for requested labs within last 8760 hours.  No results found for requested labs within last 8760 hours.   CrCl cannot be calculated (Patient's most recent lab result is older than the maximum 21 days allowed.).   Wt Readings from Last 3 Encounters:  05/03/17 177 lb 12.8 oz (80.6 kg)  04/20/16 178 lb 1.9 oz (80.8 kg)  03/25/15 176 lb 9.6 oz (80.1 kg)  Other studies reviewed: Additional studies/records reviewed today include: summarized above  ASSESSMENT AND PLAN:  1. PPM     ***  Disposition: F/u with ***  Current medicines are reviewed at length with the patient today.  The patient did not have any concerns regarding medicines.***  Signed, Tommye Standard, PA-C 05/15/2018 5:58 PM     [place] [address] [phone] (office)  [phone] (fax)

## 2018-05-17 ENCOUNTER — Encounter: Payer: Medicare Other | Admitting: Physician Assistant

## 2018-06-07 NOTE — Progress Notes (Deleted)
Electrophysiology Office Note Date: 06/07/2018  ID:  [name], DOB [birth year], MRN [id number]  PCP: Maurice Infante, MD Electrophysiologist: [name]  CC: Pacemaker follow-up  [name] is a 85 y.o. male seen today for Dr [name].  He presents today for routine electrophysiology followup.  Since last being seen in our clinic, the patient reports doing very well.  He denies chest pain, palpitations, dyspnea, PND, orthopnea, nausea, vomiting, dizziness, syncope, edema, weight gain, or early satiety.  Device History: MDT dual chamber PPM implanted 2016 for SSS   Past Medical History:  Diagnosis Date  . Macular degeneration   . Osteoporosis   . Sick sinus syndrome [facility])    a. s/p MDT dual chamber PPM implant 11/2014   Past Surgical History:  Procedure Laterality Date  . PACEMAKER INSERTION  11/2014   MDT dual chamber PPM implanted for SSS     Current Outpatient Medications  Medication Sig Dispense Refill  . Ascorbic Acid (VITAMIN C) 1000 MG tablet Take 1,000 mg by mouth daily.    . cholecalciferol (VITAMIN D) 1000 UNITS tablet Take 1,000 Units by mouth daily.    . cyanocobalamin 1000 MCG tablet Take 100 mcg by mouth daily.    Marland Kitchen OVER THE COUNTER MEDICATION Take 1 tablet by mouth daily (OTC supplement for eyes)    . zolpidem (AMBIEN) 10 MG tablet Take 5 mg by mouth at bedtime as needed for sleep.   0   No current facility-administered medications for this visit.     Allergies:   Patient has no known allergies.   Social History: Social History   Socioeconomic History  . Marital status: Married    Spouse name: Not on file  . Number of children: Not on file  . Years of education: Not on file  . Highest education level: Not on file  Occupational History  . Not on file  Social Needs  . Financial resource strain: Not on file  . Food insecurity:    Worry: Not on file    Inability: Not on file  . Transportation needs:    Medical: Not on file    Non-medical: Not  on file  Tobacco Use  . Smoking status: Never Smoker  . Smokeless tobacco: Never Used  Substance and Sexual Activity  . Alcohol use: Not on file  . Drug use: Not on file  . Sexual activity: Not on file  Lifestyle  . Physical activity:    Days per week: Not on file    Minutes per session: Not on file  . Stress: Not on file  Relationships  . Social connections:    Talks on phone: Not on file    Gets together: Not on file    Attends religious service: Not on file    Active member of club or organization: Not on file    Attends meetings of clubs or organizations: Not on file    Relationship status: Not on file  . Intimate partner violence:    Fear of current or ex partner: Not on file    Emotionally abused: Not on file    Physically abused: Not on file    Forced sexual activity: Not on file  Other Topics Concern  . Not on file  Social History Narrative  . Not on file    Family History: Family History  Problem Relation Age of Onset  . Other Mother        OLD AGE  . Stroke Father   .  Heart attack Brother   . Cancer Brother   . Cancer Brother   . Healthy Son   . Healthy Daughter      Review of Systems: All other systems reviewed and are otherwise negative except as noted above.   Physical Exam: VS:  There were no vitals taken for this visit. , BMI There is no height or weight on file to calculate BMI.  GEN- The patient is well appearing, alert and oriented x 3 today.   HEENT: normocephalic, atraumatic; sclera clear, conjunctiva pink; hearing intact; oropharynx clear; neck supple  Lungs- Clear to ausculation bilaterally, normal work of breathing.  No wheezes, rales, rhonchi Heart- Regular rate and rhythm, no murmurs, rubs or gallops  GI- soft, non-tender, non-distended, bowel sounds present  Extremities- no clubbing, cyanosis, or edema  MS- no significant deformity or atrophy Skin- warm and dry, no rash or lesion; PPM pocket well healed Psych- euthymic mood, full  affect Neuro- strength and sensation are intact  PPM Interrogation- reviewed in detail today,  See PACEART report  EKG:  EKG is not ordered today.  Recent Labs: No results found for requested labs within last 8760 hours.   Wt Readings from Last 3 Encounters:  05/03/17 177 lb 12.8 oz (80.6 kg)  04/20/16 178 lb 1.9 oz (80.8 kg)  03/25/15 176 lb 9.6 oz (80.1 kg)     Other studies Reviewed: Additional studies/ records that were reviewed today include: Dr [name] office notes   Assessment and Plan:  1.  Symptomatic bradycardia Normal PPM function See Pace Art report No changes today   Current medicines are reviewed at length with the patient today.   The patient does not have concerns regarding his medicines.  The following changes were made today:  none  Labs/ tests ordered today include: none No orders of the defined types were placed in this encounter.    Disposition:   Follow up with [facility], me in 1 year    Signed, Maurice Marshall, NP 06/07/2018 2:40 PM  [place] [address] [phone] (office) [phone] (fax)

## 2018-06-08 ENCOUNTER — Ambulatory Visit: Payer: Medicare Other | Admitting: Nurse Practitioner

## 2018-06-08 ENCOUNTER — Encounter: Payer: Medicare Other | Admitting: Nurse Practitioner

## 2018-06-08 VITALS — BP 140/77 | HR 77 | Ht 71.0 in | Wt 176.0 lb

## 2018-06-08 DIAGNOSIS — I495 Sick sinus syndrome: Secondary | ICD-10-CM | POA: Diagnosis not present

## 2018-06-08 LAB — CUP PACEART INCLINIC DEVICE CHECK
Implantable Lead Implant Date: 20160724
Implantable Lead Location: 753860
MDC IDC LEAD IMPLANT DT: 20160724
MDC IDC LEAD LOCATION: 753859
MDC IDC PG IMPLANT DT: 20160724
MDC IDC SESS DTM: 20200108132850

## 2018-06-08 NOTE — Progress Notes (Signed)
Electrophysiology Office Note Date: 06/08/2018  ID:  Foye, Damron [birth year], MRN [id number]  PCP: Crist Infante, MD Electrophysiologist: Rayann Heman  CC: Pacemaker follow-up  [name] is a 85 y.o. male seen today for Dr Rayann Heman.  He presents today for routine electrophysiology followup.  Since last being seen in our clinic, the patient reports doing very well.  He has LE edema and has been wearing compression hose. He denies chest pain, palpitations, dyspnea, PND, orthopnea, nausea, vomiting, dizziness, syncope, weight gain, or early satiety.  Device History: MDT dual chamber PPM implanted 2016 for SSS   Past Medical History:  Diagnosis Date  . Macular degeneration   . Osteoporosis   . Sick sinus syndrome [facility])    a. s/p MDT dual chamber PPM implant 11/2014   Past Surgical History:  Procedure Laterality Date  . PACEMAKER INSERTION  11/2014   MDT dual chamber PPM implanted for SSS     Current Outpatient Medications  Medication Sig Dispense Refill  . Ascorbic Acid (VITAMIN C) 1000 MG tablet Take 1,000 mg by mouth daily.    . cholecalciferol (VITAMIN D) 1000 UNITS tablet Take 1,000 Units by mouth daily.    . cyanocobalamin 1000 MCG tablet Take 100 mcg by mouth daily.    Marland Kitchen OVER THE COUNTER MEDICATION Take 1 tablet by mouth daily (OTC supplement for eyes)    . zolpidem (AMBIEN) 10 MG tablet Take 5 mg by mouth at bedtime as needed for sleep.   0   No current facility-administered medications for this visit.     Allergies:   Patient has no known allergies.   Social History: Social History   Socioeconomic History  . Marital status: Married    Spouse name: Not on file  . Number of children: Not on file  . Years of education: Not on file  . Highest education level: Not on file  Occupational History  . Not on file  Social Needs  . Financial resource strain: Not on file  . Food insecurity:    Worry: Not on file    Inability: Not on file  . Transportation needs:     Medical: Not on file    Non-medical: Not on file  Tobacco Use  . Smoking status: Never Smoker  . Smokeless tobacco: Never Used  Substance and Sexual Activity  . Alcohol use: Not on file  . Drug use: Not on file  . Sexual activity: Not on file  Lifestyle  . Physical activity:    Days per week: Not on file    Minutes per session: Not on file  . Stress: Not on file  Relationships  . Social connections:    Talks on phone: Not on file    Gets together: Not on file    Attends religious service: Not on file    Active member of club or organization: Not on file    Attends meetings of clubs or organizations: Not on file    Relationship status: Not on file  . Intimate partner violence:    Fear of current or ex partner: Not on file    Emotionally abused: Not on file    Physically abused: Not on file    Forced sexual activity: Not on file  Other Topics Concern  . Not on file  Social History Narrative  . Not on file    Family History: Family History  Problem Relation Age of Onset  . Other Mother  OLD AGE  . Stroke Father   . Heart attack Brother   . Cancer Brother   . Cancer Brother   . Healthy Son   . Healthy Daughter      Review of Systems: All other systems reviewed and are otherwise negative except as noted above.   Physical Exam: VS:  BP 140/77   Pulse 77   Ht 5\' 11"  (1.803 m)   Wt 176 lb (79.8 kg)   SpO2 96%   BMI 24.55 kg/m  , BMI Body mass index is 24.55 kg/m.  GEN- The patient is elderly appearing, alert and oriented x 3 today.   HEENT: normocephalic, atraumatic; sclera clear, conjunctiva pink; hearing intact; oropharynx clear; neck supple  Lungs- Clear to ausculation bilaterally, normal work of breathing.  No wheezes, rales, rhonchi Heart- Regular rate and rhythm (paced) GI- soft, non-tender, non-distended, bowel sounds present  Extremities- no clubbing, cyanosis, or edema  MS- no significant deformity or atrophy Skin- warm and dry, no rash  or lesion; PPM pocket well healed Psych- euthymic mood, full affect Neuro- strength and sensation are intact  PPM Interrogation- reviewed in detail today,  See PACEART report  EKG:  EKG is not ordered today.  Recent Labs: No results found for requested labs within last 8760 hours.   Wt Readings from Last 3 Encounters:  06/08/18 176 lb (79.8 kg)  05/03/17 177 lb 12.8 oz (80.6 kg)  04/20/16 178 lb 1.9 oz (80.8 kg)     Other studies Reviewed: Additional studies/ records that were reviewed today include: Dr Jackalyn Lombard office notes   Assessment and Plan:  1.  Symptomatic sinus bradycardia Normal PPM function See Pace Art report No changes today His V pacing percentage has increased over the last few weeks.  He is in heart block today with a ventricular escape in the 40's. No HF symptoms aside from some LE edema.   Current medicines are reviewed at length with the patient today.   The patient does not have concerns regarding his medicines.  The following changes were made today:  none  Labs/ tests ordered today include:  Orders Placed This Encounter  Procedures  . CUP PACEART INCLINIC DEVICE CHECK     Disposition:   Follow up with [facility], me or Dr Rayann Heman in 1 year      Signed, Chanetta Marshall, NP 06/08/2018 2:07 PM  [place] [address] [place] [address] [phone] (office) [phone] (fax)

## 2018-06-08 NOTE — Patient Instructions (Addendum)
Medication Instructions:  none If you need a refill on your cardiac medications before your next appointment, please call your pharmacy.   Lab work: none If you have labs (blood work) drawn today and your tests are completely normal, you will receive your results only by: Marland Kitchen MyChart Message (if you have MyChart) OR . A paper copy in the mail If you have any lab test that is abnormal or we need to change your treatment, we will call you to review the results.  Testing/Procedures: none  Follow-Up: At [facility], you and your health needs are our priority.  As part of our continuing mission to provide you with exceptional heart care, we have created designated Provider Care Teams.  These Care Teams include your primary Cardiologist (physician) and Advanced Practice Providers (APPs -  Physician Assistants and Nurse Practitioners) who all work together to provide you with the care you need, when you need it. You will need a follow up appointment in 1 years.  Please call our office 2 months in advance to schedule this appointment.  You may see Dr Rayann Heman  or one of the following Advanced Practice Providers on your designated Care Team:   Chanetta Marshall, NP . Tommye Standard, PA-C  Any Other Special Instructions Will Be Listed Below (If Applicable).  Remote monitoring is used to monitor your Pacemaker  from home. This monitoring reduces the number of office visits required to check your device to one time per year. It allows [place] to keep an eye on the functioning of your device to ensure it is working properly. You are scheduled for a device check from home on 07/18/2018. You may send your transmission at any time that day. If you have a wireless device, the transmission will be sent automatically. After your physician reviews your transmission, you will receive a postcard with your next transmission date.

## 2018-06-14 LAB — CUP PACEART REMOTE DEVICE CHECK
Battery Remaining Longevity: 76 mo
Brady Statistic AP VP Percent: 19.87 %
Brady Statistic AP VS Percent: 60.47 %
Brady Statistic AS VP Percent: 3.98 %
Brady Statistic AS VS Percent: 15.68 %
Brady Statistic RV Percent Paced: 24.13 %
Date Time Interrogation Session: 20191118224042
Implantable Lead Implant Date: 20160724
Implantable Lead Location: 753860
Implantable Pulse Generator Implant Date: 20160724
Lead Channel Pacing Threshold Amplitude: 0.75 V
Lead Channel Pacing Threshold Pulse Width: 0.4 ms
Lead Channel Sensing Intrinsic Amplitude: 1.75 mV
Lead Channel Sensing Intrinsic Amplitude: 8.5 mV
Lead Channel Sensing Intrinsic Amplitude: 8.5 mV
Lead Channel Setting Pacing Amplitude: 2 V
Lead Channel Setting Pacing Amplitude: 2.5 V
Lead Channel Setting Pacing Pulse Width: 0.4 ms
Lead Channel Setting Sensing Sensitivity: 0.9 mV
MDC IDC LEAD IMPLANT DT: 20160724
MDC IDC LEAD LOCATION: 753859
MDC IDC MSMT BATTERY VOLTAGE: 3.01 V
MDC IDC MSMT LEADCHNL RA IMPEDANCE VALUE: 266 Ohm
MDC IDC MSMT LEADCHNL RA IMPEDANCE VALUE: 323 Ohm
MDC IDC MSMT LEADCHNL RA PACING THRESHOLD AMPLITUDE: 0.75 V
MDC IDC MSMT LEADCHNL RA SENSING INTR AMPL: 1.75 mV
MDC IDC MSMT LEADCHNL RV IMPEDANCE VALUE: 361 Ohm
MDC IDC MSMT LEADCHNL RV IMPEDANCE VALUE: 475 Ohm
MDC IDC MSMT LEADCHNL RV PACING THRESHOLD PULSEWIDTH: 0.4 ms
MDC IDC STAT BRADY RA PERCENT PACED: 78.18 %

## 2018-07-18 ENCOUNTER — Ambulatory Visit (INDEPENDENT_AMBULATORY_CARE_PROVIDER_SITE_OTHER): Payer: Medicare Other

## 2018-07-18 DIAGNOSIS — I495 Sick sinus syndrome: Secondary | ICD-10-CM | POA: Diagnosis not present

## 2018-07-19 LAB — CUP PACEART REMOTE DEVICE CHECK
Brady Statistic AP VS Percent: 0.01 %
Brady Statistic AS VP Percent: 14.83 %
Brady Statistic AS VS Percent: 0.03 %
Brady Statistic RV Percent Paced: 99.52 %
Implantable Lead Location: 753859
Implantable Lead Model: 5076
Lead Channel Impedance Value: 285 Ohm
Lead Channel Impedance Value: 323 Ohm
Lead Channel Impedance Value: 361 Ohm
Lead Channel Impedance Value: 418 Ohm
Lead Channel Pacing Threshold Amplitude: 0.625 V
Lead Channel Pacing Threshold Pulse Width: 0.4 ms
Lead Channel Sensing Intrinsic Amplitude: 2.25 mV
Lead Channel Sensing Intrinsic Amplitude: 8 mV
Lead Channel Sensing Intrinsic Amplitude: 8 mV
Lead Channel Setting Pacing Amplitude: 2.5 V
Lead Channel Setting Pacing Pulse Width: 0.4 ms
MDC IDC LEAD IMPLANT DT: 20160724
MDC IDC LEAD IMPLANT DT: 20160724
MDC IDC LEAD LOCATION: 753860
MDC IDC MSMT BATTERY REMAINING LONGEVITY: 66 mo
MDC IDC MSMT BATTERY VOLTAGE: 3 V
MDC IDC MSMT LEADCHNL RA PACING THRESHOLD AMPLITUDE: 0.625 V
MDC IDC MSMT LEADCHNL RA PACING THRESHOLD PULSEWIDTH: 0.4 ms
MDC IDC MSMT LEADCHNL RA SENSING INTR AMPL: 2.25 mV
MDC IDC PG IMPLANT DT: 20160724
MDC IDC SESS DTM: 20200218174931
MDC IDC SET LEADCHNL RA PACING AMPLITUDE: 2 V
MDC IDC SET LEADCHNL RV SENSING SENSITIVITY: 0.9 mV
MDC IDC STAT BRADY AP VP PERCENT: 85.13 %
MDC IDC STAT BRADY RA PERCENT PACED: 82.93 %

## 2018-07-20 ENCOUNTER — Telehealth: Payer: Self-pay

## 2018-07-20 NOTE — Telephone Encounter (Signed)
Spoke with patient to remind of missed remote transmission 

## 2018-07-27 NOTE — Progress Notes (Signed)
Remote pacemaker transmission.   

## 2018-10-17 ENCOUNTER — Ambulatory Visit (INDEPENDENT_AMBULATORY_CARE_PROVIDER_SITE_OTHER): Payer: Medicare Other | Admitting: *Deleted

## 2018-10-17 ENCOUNTER — Other Ambulatory Visit: Payer: Self-pay

## 2018-10-17 DIAGNOSIS — I495 Sick sinus syndrome: Secondary | ICD-10-CM

## 2018-10-18 ENCOUNTER — Telehealth: Payer: Self-pay

## 2018-10-18 LAB — CUP PACEART REMOTE DEVICE CHECK
Battery Remaining Longevity: 65 mo
Battery Voltage: 3 V
Brady Statistic AP VP Percent: 83.37 %
Brady Statistic AP VS Percent: 0.01 %
Brady Statistic AS VP Percent: 16.59 %
Brady Statistic AS VS Percent: 0.03 %
Brady Statistic RA Percent Paced: 80.72 %
Brady Statistic RV Percent Paced: 99.33 %
Date Time Interrogation Session: 20200519142437
Implantable Lead Implant Date: 20160724
Implantable Lead Implant Date: 20160724
Implantable Lead Location: 753859
Implantable Lead Location: 753860
Implantable Lead Model: 5076
Implantable Lead Model: 5076
Implantable Pulse Generator Implant Date: 20160724
Lead Channel Impedance Value: 285 Ohm
Lead Channel Impedance Value: 323 Ohm
Lead Channel Impedance Value: 342 Ohm
Lead Channel Impedance Value: 399 Ohm
Lead Channel Pacing Threshold Amplitude: 0.625 V
Lead Channel Pacing Threshold Amplitude: 0.625 V
Lead Channel Pacing Threshold Pulse Width: 0.4 ms
Lead Channel Pacing Threshold Pulse Width: 0.4 ms
Lead Channel Sensing Intrinsic Amplitude: 2.25 mV
Lead Channel Sensing Intrinsic Amplitude: 2.25 mV
Lead Channel Sensing Intrinsic Amplitude: 5.375 mV
Lead Channel Sensing Intrinsic Amplitude: 5.375 mV
Lead Channel Setting Pacing Amplitude: 2 V
Lead Channel Setting Pacing Amplitude: 2.5 V
Lead Channel Setting Pacing Pulse Width: 0.4 ms
Lead Channel Setting Sensing Sensitivity: 0.9 mV

## 2018-10-18 NOTE — Telephone Encounter (Signed)
Left message for patient to remind of missed remote transmission.  

## 2018-10-26 ENCOUNTER — Encounter: Payer: Self-pay | Admitting: Cardiology

## 2018-10-26 NOTE — Progress Notes (Signed)
Remote pacemaker transmission.   

## 2019-01-16 ENCOUNTER — Ambulatory Visit (INDEPENDENT_AMBULATORY_CARE_PROVIDER_SITE_OTHER): Payer: Medicare Other | Admitting: *Deleted

## 2019-01-16 DIAGNOSIS — I495 Sick sinus syndrome: Secondary | ICD-10-CM

## 2019-01-17 ENCOUNTER — Telehealth: Payer: Self-pay

## 2019-01-17 LAB — CUP PACEART REMOTE DEVICE CHECK
Battery Remaining Longevity: 59 mo
Battery Voltage: 3 V
Brady Statistic AP VP Percent: 80.95 %
Brady Statistic AP VS Percent: 0.01 %
Brady Statistic AS VP Percent: 19.02 %
Brady Statistic AS VS Percent: 0.02 %
Brady Statistic RA Percent Paced: 78.19 %
Brady Statistic RV Percent Paced: 99.68 %
Date Time Interrogation Session: 20200818162127
Implantable Lead Implant Date: 20160724
Implantable Lead Implant Date: 20160724
Implantable Lead Location: 753859
Implantable Lead Location: 753860
Implantable Lead Model: 5076
Implantable Lead Model: 5076
Implantable Pulse Generator Implant Date: 20160724
Lead Channel Impedance Value: 285 Ohm
Lead Channel Impedance Value: 323 Ohm
Lead Channel Impedance Value: 342 Ohm
Lead Channel Impedance Value: 380 Ohm
Lead Channel Pacing Threshold Amplitude: 0.625 V
Lead Channel Pacing Threshold Amplitude: 0.625 V
Lead Channel Pacing Threshold Pulse Width: 0.4 ms
Lead Channel Pacing Threshold Pulse Width: 0.4 ms
Lead Channel Sensing Intrinsic Amplitude: 14.125 mV
Lead Channel Sensing Intrinsic Amplitude: 14.125 mV
Lead Channel Sensing Intrinsic Amplitude: 2.625 mV
Lead Channel Sensing Intrinsic Amplitude: 2.625 mV
Lead Channel Setting Pacing Amplitude: 2 V
Lead Channel Setting Pacing Amplitude: 2.5 V
Lead Channel Setting Pacing Pulse Width: 0.4 ms
Lead Channel Setting Sensing Sensitivity: 0.9 mV

## 2019-01-17 NOTE — Telephone Encounter (Signed)
Left message for patient to remind of missed remote transmission.  

## 2019-01-25 ENCOUNTER — Encounter: Payer: Self-pay | Admitting: Cardiology

## 2019-01-25 NOTE — Progress Notes (Signed)
Remote pacemaker transmission.   

## 2019-04-14 ENCOUNTER — Telehealth: Payer: Self-pay | Admitting: Hematology and Oncology

## 2019-04-14 NOTE — Telephone Encounter (Signed)
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 [name] has been cld and scheduled to see Dr. Lorenso Courier on 11/23 at [place] date and time has been given to the pt's daughter. She's aware that she can attend the appt w/her father and to arrive 15 minutes early.

## 2019-04-17 ENCOUNTER — Ambulatory Visit (INDEPENDENT_AMBULATORY_CARE_PROVIDER_SITE_OTHER): Payer: Medicare Other | Admitting: *Deleted

## 2019-04-17 DIAGNOSIS — I495 Sick sinus syndrome: Secondary | ICD-10-CM

## 2019-04-20 ENCOUNTER — Telehealth: Payer: Self-pay | Admitting: Emergency Medicine

## 2019-04-20 LAB — CUP PACEART REMOTE DEVICE CHECK
Battery Remaining Longevity: 47 mo
Battery Voltage: 2.99 V
Brady Statistic AP VP Percent: 32.72 %
Brady Statistic AP VS Percent: 0 %
Brady Statistic AS VP Percent: 66.96 %
Brady Statistic AS VS Percent: 0.32 %
Brady Statistic RA Percent Paced: 27.21 %
Brady Statistic RV Percent Paced: 99.58 %
Date Time Interrogation Session: 20201118152617
Implantable Lead Implant Date: 20160724
Implantable Lead Implant Date: 20160724
Implantable Lead Location: 753859
Implantable Lead Location: 753860
Implantable Lead Model: 5076
Implantable Lead Model: 5076
Implantable Pulse Generator Implant Date: 20160724
Lead Channel Impedance Value: 266 Ohm
Lead Channel Impedance Value: 304 Ohm
Lead Channel Impedance Value: 323 Ohm
Lead Channel Impedance Value: 380 Ohm
Lead Channel Pacing Threshold Amplitude: 0.625 V
Lead Channel Pacing Threshold Amplitude: 0.75 V
Lead Channel Pacing Threshold Pulse Width: 0.4 ms
Lead Channel Pacing Threshold Pulse Width: 0.4 ms
Lead Channel Sensing Intrinsic Amplitude: 1.25 mV
Lead Channel Sensing Intrinsic Amplitude: 1.25 mV
Lead Channel Sensing Intrinsic Amplitude: 7.75 mV
Lead Channel Sensing Intrinsic Amplitude: 7.75 mV
Lead Channel Setting Pacing Amplitude: 2 V
Lead Channel Setting Pacing Amplitude: 2.5 V
Lead Channel Setting Pacing Pulse Width: 0.4 ms
Lead Channel Setting Sensing Sensitivity: 0.9 mV

## 2019-04-20 NOTE — Telephone Encounter (Signed)
LMOM. Patient in continuous AF since mid September. Assess for symptoms.

## 2019-04-24 ENCOUNTER — Inpatient Hospital Stay: Payer: Medicare Other | Attending: Hematology and Oncology | Admitting: Hematology and Oncology

## 2019-04-24 ENCOUNTER — Other Ambulatory Visit: Payer: Self-pay

## 2019-04-24 ENCOUNTER — Inpatient Hospital Stay: Payer: Medicare Other

## 2019-04-24 VITALS — BP 152/85 | HR 88 | Temp 98.3°F | Resp 18 | Ht 71.0 in | Wt 170.5 lb

## 2019-04-24 DIAGNOSIS — I495 Sick sinus syndrome: Secondary | ICD-10-CM | POA: Insufficient documentation

## 2019-04-24 DIAGNOSIS — M810 Age-related osteoporosis without current pathological fracture: Secondary | ICD-10-CM | POA: Diagnosis not present

## 2019-04-24 DIAGNOSIS — D472 Monoclonal gammopathy: Secondary | ICD-10-CM | POA: Diagnosis present

## 2019-04-24 DIAGNOSIS — D649 Anemia, unspecified: Secondary | ICD-10-CM | POA: Insufficient documentation

## 2019-04-24 DIAGNOSIS — Z79899 Other long term (current) drug therapy: Secondary | ICD-10-CM | POA: Diagnosis not present

## 2019-04-24 DIAGNOSIS — Z950 Presence of cardiac pacemaker: Secondary | ICD-10-CM | POA: Insufficient documentation

## 2019-04-24 LAB — CBC WITH DIFFERENTIAL (CANCER CENTER ONLY)
Abs Immature Granulocytes: 0.03 10*3/uL (ref 0.00–0.07)
Basophils Absolute: 0.1 10*3/uL (ref 0.0–0.1)
Basophils Relative: 1 %
Eosinophils Absolute: 0.1 10*3/uL (ref 0.0–0.5)
Eosinophils Relative: 1 %
HCT: 39.4 % (ref 39.0–52.0)
Hemoglobin: 12.6 g/dL — ABNORMAL LOW (ref 13.0–17.0)
Immature Granulocytes: 0 %
Lymphocytes Relative: 31 %
Lymphs Abs: 3.1 10*3/uL (ref 0.7–4.0)
MCH: 31.7 pg (ref 26.0–34.0)
MCHC: 32 g/dL (ref 30.0–36.0)
MCV: 99.2 fL (ref 80.0–100.0)
Monocytes Absolute: 1 10*3/uL (ref 0.1–1.0)
Monocytes Relative: 10 %
Neutro Abs: 5.7 10*3/uL (ref 1.7–7.7)
Neutrophils Relative %: 57 %
Platelet Count: 185 10*3/uL (ref 150–400)
RBC: 3.97 MIL/uL — ABNORMAL LOW (ref 4.22–5.81)
RDW: 14.6 % (ref 11.5–15.5)
WBC Count: 10 10*3/uL (ref 4.0–10.5)
nRBC: 0 % (ref 0.0–0.2)

## 2019-04-24 LAB — CMP (CANCER CENTER ONLY)
ALT: 13 U/L (ref 0–44)
AST: 20 U/L (ref 15–41)
Albumin: 3.6 g/dL (ref 3.5–5.0)
Alkaline Phosphatase: 56 U/L (ref 38–126)
Anion gap: 9 (ref 5–15)
BUN: 22 mg/dL (ref 8–23)
CO2: 26 mmol/L (ref 22–32)
Calcium: 8.7 mg/dL — ABNORMAL LOW (ref 8.9–10.3)
Chloride: 105 mmol/L (ref 98–111)
Creatinine: 1.22 mg/dL (ref 0.61–1.24)
GFR, Est AFR Am: 59 mL/min — ABNORMAL LOW (ref >60)
GFR, Estimated: 51 mL/min — ABNORMAL LOW (ref >60)
Glucose, Bld: 93 mg/dL (ref 70–99)
Potassium: 4.6 mmol/L (ref 3.5–5.1)
Sodium: 140 mmol/L (ref 135–145)
Total Bilirubin: 0.7 mg/dL (ref 0.3–1.2)
Total Protein: 7.5 g/dL (ref 6.5–8.1)

## 2019-04-24 LAB — LACTATE DEHYDROGENASE: LDH: 209 U/L — ABNORMAL HIGH (ref 98–192)

## 2019-04-24 NOTE — Progress Notes (Signed)
[place] [phone]   Fax:([phone]) [place] NOTE  Patient Care Team: Crist Infante, MD as PCP - General (Internal Medicine)  Hematological/Oncological History # Monoclonal Gammopathy of Undetermined Significance  1) 03/10/2019: M prrotein 1.2, Gamma globulin 3.5, IgG 2132 (elevated).  Cr 1.4, Ca 8.3, Hgb 11.5 2) 04/24/2019: establish care with Dr. Lorenso Courier    CHIEF COMPLAINTS/PURPOSE OF CONSULTATION:  Elevated M protein on SPEP.   HISTORY OF PRESENTING ILLNESS:  [name] 85 y.o. male with medical history significant for osteoporosis and sick sinus syndrome who presents for evaluation of a newly diagnosed MGUS.  On review of the outside records Mr. Tsang was seen by his PCP on 03/10/2019. He was noted to have a mild anemia and an anemia workup was initiated that included an SPEP. The test showed M prrotein 1.2, Gamma globulin 3.5, and gG 2132 (elevated). He had a mild anemia with Hgb 11.5, but otherwise his CMP and CBC were WNL. He was sent to hematology for further review.  On exam today Mr. Cahalan notes that he feels well.  He notes that he has good energy and that he lives alone and takes care of his yard.  He takes breaks, but does not note any recent changes in his energy level or work tolerance.  He does endorse having easy bruising, however he denies any overt signs of bleeding with no bright red blood per rectum or dark stools.  He does note that his stool cards have been positive over the last few years, however no GI intervention was recommended due to his advanced age.  He denies any recent infections.  A 10 point ROS is otherwise negative listed below.  His family history is noncontributory as there is no family history of any hematological disorders.  He notes that he smoked cigars rarely years ago, however he does not do this anymore.  He notes that he does drink alcohol couple times per week in the form of wine with dinner.  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  MEDICAL HISTORY:  Past Medical History:  Diagnosis Date  . Macular degeneration   . Osteoporosis   . Sick sinus syndrome [facility])    a. s/p MDT dual chamber PPM implant 11/2014    SURGICAL HISTORY: Past Surgical History:  Procedure Laterality Date  . PACEMAKER INSERTION  11/2014   MDT dual chamber PPM implanted for SSS     SOCIAL HISTORY: Social History   Socioeconomic History  . Marital status: Married    Spouse name: Not on file  . Number of children: Not on file  . Years of education: Not on file  . Highest education level: Not on file  Occupational History  . Not on file  Social Needs  . Financial resource strain: Not on file  . Food insecurity    Worry: Not on file    Inability: Not on file  . Transportation needs    Medical: Not on file    Non-medical: Not on file  Tobacco Use  . Smoking status: Never Smoker  . Smokeless tobacco: Never Used  Substance and Sexual Activity  . Alcohol use: Not on file  . Drug use: Not on file  . Sexual activity: Not on file  Lifestyle  . Physical activity    Days per week: Not on file    Minutes per session: Not on file  . Stress: Not on file  Relationships  . Social Herbalist on phone: Not on file    Gets together: Not on file    Attends religious service: Not on file    Active member of club or organization: Not on file    Attends meetings of clubs or organizations: Not on file    Relationship status: Not on file  . Intimate partner violence    Fear of current or ex partner: Not on file    Emotionally abused: Not on file    Physically abused: Not on file    Forced sexual activity: Not on file  Other Topics Concern  . Not on file  Social History Narrative  . Not on file    FAMILY HISTORY: Family History  Problem Relation Age of Onset  . Other Mother        OLD AGE  . Stroke Father   . Heart attack Brother   . Cancer  Brother   . Cancer Brother   . Healthy Son   . Healthy Daughter     ALLERGIES:  has No Known Allergies.  MEDICATIONS:  Current Outpatient Medications  Medication Sig Dispense Refill  . Ascorbic Acid (VITAMIN C) 1000 MG tablet Take 1,000 mg by mouth daily.    . cholecalciferol (VITAMIN D) 1000 UNITS tablet Take 1,000 Units by mouth daily.    . cyanocobalamin 1000 MCG tablet Take 100 mcg by mouth daily.    Marland Kitchen OVER THE COUNTER MEDICATION Take 1 tablet by mouth daily (OTC supplement for eyes)    . zolpidem (AMBIEN) 10 MG tablet Take 5 mg by mouth at bedtime as needed for sleep.   0   No current facility-administered medications for this visit.     REVIEW OF SYSTEMS:   Constitutional: ( - ) fevers, ( - )  chills , ( - ) night sweats Eyes: ( - ) blurriness of vision, ( - ) double vision, ( - ) watery eyes Ears, nose, mouth, throat, and face: ( - ) mucositis, ( - ) sore throat Respiratory: ( - ) cough, ( - ) dyspnea, ( - ) wheezes Cardiovascular: ( - ) palpitation, ( - ) chest discomfort, ( - ) lower extremity swelling Gastrointestinal:  ( - ) nausea, ( - ) heartburn, ( - ) change in bowel habits Skin: ( - ) abnormal skin rashes Lymphatics: ( - ) new lymphadenopathy, ( - ) easy bruising Neurological: ( - ) numbness, ( - ) tingling, ( - ) new weaknesses Behavioral/Psych: ( - ) mood change, ( - ) new changes  All other systems were reviewed with the patient and are negative.  PHYSICAL EXAMINATION: ECOG PERFORMANCE STATUS: 1 - Symptomatic but completely ambulatory  Vitals:   04/24/19 0915  BP: (!) 152/85  Pulse: 88  Resp: 18  Temp: 98.3 F (36.8 C)  SpO2: 99%   Filed Weights   04/24/19 0915  Weight: 170 lb 8 oz (77.3 kg)    GENERAL: well appearing elderly Caucasian male in NAD  SKIN: skin color, texture, turgor are normal, no rashes or significant lesions. Mild bruising on arms.  EYES: conjunctiva are pink and non-injected, sclera clear LUNGS: clear to auscultation and  percussion with normal breathing effort HEART: regular rate & rhythm and no murmurs and no lower extremity edema ABDOMEN: soft, non-tender, non-distended, normal bowel sounds Musculoskeletal: no cyanosis of digits and no clubbing  PSYCH: alert & oriented x 3, fluent speech NEURO: no focal motor/sensory deficits  LABORATORY DATA:  I have reviewed the data as listed Lab Results  Component Value Date   WBC 12.3 (H) 08/11/2007   HGB 9.8 (L) 08/11/2007   HCT 27.9 (L) 08/11/2007   MCV 89.1 08/11/2007   PLT 155 08/11/2007   Outside labs reviewed: M prrotein 1.2, Gamma globulin 3.5, IgG 2132 (elevated).  Cr 1.4, Ca 8.3, Hgb 11.5  PATHOLOGY: None to review.   RADIOGRAPHIC STUDIES: None to review.  ASSESSMENT & PLAN CORRIN HINGLE 85 y.o. male with medical history significant for osteoporosis and sick sinus syndrome who presents for evaluation of a newly diagnosed MGUS. On review of his outside labs, Mr. Ferrentino has no CRAB criteria, with near normal Hgb, Ca, and Cr levels. His M protein is elevated at 1.2, however no SFLC are available at this time. On initial review, his findings are consistent with MGUS, however we will completed the workup with SFLC and bone survey to assure no lytic lesions or marked variation in the [facility] ratio.  MGUS is a markedly common condition, present in over 3% of people over the age of 32 and over 5% over the age of 70% ([place] 6803;212:2482-5003). Progression rates are low and typically sited at 1% per year. Bone marrow biopsy is indicated in the event CRAB criteria are present, SFLC ratio is <1.65, nonIgG MGUS, or M protein >1.5. ( Leukemia. 2010;24(6):1121-1127). Fortunately at this time Mr. Carey does not meet the requirement for BmBx, though he still needs SFLC and bone survey to complete the workup. In the event no other concerning abnormalities are noted we can f/u with him in 3 months time. If stable, we can consider q6 month evaluation after that.   #Monoclonal Gammopathy of Undetermined Significance --will order SPEP, SFLC, Quant Igs, beta-2-microglobulin and 24 hr UPEP today --baseline CMP and CBC to be ordered today --will order a bone survey today  --no clear indication for a bone marrow biopsy at this time based on outside labs --RTC in 3 months time with earlier return if concerning abnormality is noted in his above labs/imaging.   Orders Placed This Encounter  Procedures  . CBC with Differential (Cancer Center Only)    Standing Status:   Future    Number of Occurrences:   1    Standing Expiration Date:   04/23/2020  . CMP ([place] only)    Standing Status:   Future    Number of Occurrences:   1    Standing Expiration Date:   04/23/2020  . Lactate dehydrogenase (LDH)    Standing Status:   Future    Number of Occurrences:   1    Standing Expiration Date:   04/23/2020  . Multiple Myeloma Panel (SPEP&IFE w/QIG)    Standing Status:   Future    Number of Occurrences:   1    Standing Expiration Date:   04/23/2020  . Kappa/lambda light chains    Standing Status:   Future    Number of Occurrences:   1    Standing Expiration Date:   04/23/2020  . 24-Hr Ur UPEP/UIFE/Light Chains/TP    Standing Status:   Future    Standing Expiration Date:   04/23/2020  . Beta 2 microglobulin    Standing Status:   Future    Number of Occurrences:   1    Standing Expiration Date:   04/23/2020   All questions were answered. The patient knows to call the clinic with any problems, questions or concerns.  A  total of more than 45 minutes were spent face-to-face with the patient during this encounter and over half of that time was spent on counseling and coordination of care as outlined above.   Ledell Peoples, MD Department of Hematology/Oncology [place] at [facility] Phone: [phone] Pager: [phone] Email: [email or web address]  04/24/2019 10:11 AM   Literature Support:  1) Kyle RA, Durie  BG, Rajkumar SV, et al. Monoclonal gammopathy of undetermined significance (MGUS) and smoldering (asymptomatic) multiple myeloma: IMWG consensus perspectives risk factors for progression and guidelines for monitoring and management. Leukemia. 2010;24(6):1121-1127. doi:10.1038/leu.2010.60   --If a patient with apparent MGUS has a serum monoclonal protein >15 g/l, IgA or IgM protein type, or an abnormal FLC ratio, a BM aspirate and biopsy should be carried out at baseline to rule out underlying PC malignancy.   2) Marylyn Ishihara RA, Therneau TM, Rajkumar SV, et al. Prevalence of monoclonal gammopathy of undetermined significance. N Engl J Med 7519;824:2998-0699   --MGUS was found in 3.2 percent of persons 63 years of age or older and 5.3 percent of persons 79 years of age or older.

## 2019-04-25 LAB — KAPPA/LAMBDA LIGHT CHAINS
Kappa free light chain: 40.2 mg/L — ABNORMAL HIGH (ref 3.3–19.4)
Kappa, lambda light chain ratio: 1.47 (ref 0.26–1.65)
Lambda free light chains: 27.4 mg/L — ABNORMAL HIGH (ref 5.7–26.3)

## 2019-04-25 LAB — BETA 2 MICROGLOBULIN, SERUM: Beta-2 Microglobulin: 2.7 mg/L — ABNORMAL HIGH (ref 0.6–2.4)

## 2019-04-27 LAB — MULTIPLE MYELOMA PANEL, SERUM
Albumin SerPl Elph-Mcnc: 3.2 g/dL (ref 2.9–4.4)
Albumin/Glob SerPl: 0.9 (ref 0.7–1.7)
Alpha 1: 0.2 g/dL (ref 0.0–0.4)
Alpha2 Glob SerPl Elph-Mcnc: 0.6 g/dL (ref 0.4–1.0)
B-Globulin SerPl Elph-Mcnc: 0.7 g/dL (ref 0.7–1.3)
Gamma Glob SerPl Elph-Mcnc: 2 g/dL — ABNORMAL HIGH (ref 0.4–1.8)
Globulin, Total: 3.6 g/dL (ref 2.2–3.9)
IgA: 211 mg/dL (ref 61–437)
IgG (Immunoglobin G), Serum: 2310 mg/dL — ABNORMAL HIGH (ref 603–1613)
IgM (Immunoglobulin M), Srm: 37 mg/dL (ref 15–143)
M Protein SerPl Elph-Mcnc: 1.1 g/dL — ABNORMAL HIGH
Total Protein ELP: 6.8 g/dL (ref 6.0–8.5)

## 2019-05-03 ENCOUNTER — Other Ambulatory Visit: Payer: Self-pay

## 2019-05-03 DIAGNOSIS — Z20822 Contact with and (suspected) exposure to covid-19: Secondary | ICD-10-CM

## 2019-05-03 NOTE — Telephone Encounter (Signed)
We will try for a virtual visit after bone survey.

## 2019-05-03 NOTE — Telephone Encounter (Signed)
Spoke with patient. He has been asymptomatic with persistent AF/A-flutter. Requests that I contact his daughter, Manuela Schwartz, to schedule an appointment with Dr. Rayann Heman.  Spoke with Manuela Schwartz. Explained findings and offered appointment to discuss [place]. She reports pt has an "issue with his blood" and is undergoing workup by Dr. Lorenso Courier. Per notes, pt diagnosed with "monoclonal gammopathy of undetermined significance." Scheduled for bone survey on 05/22/19. Additionally, Susan's husband has tested positive for COVID-19 and pt was potentially exposed at Thanksgiving dinner. Pt tested today, results pending. Manuela Schwartz reports she is not able to see the pt right now due to quarantine, unable to help with a virtual visit until she can see him again. Agreeable to waiting until after bone survey on 12/21 to schedule f/u with Dr. Rayann Heman (virtual or in-clinic with EP APP), unless Dr. Rayann Heman has additional recommendations in the interim. Manuela Schwartz expressed appreciation of call and denies additional concerns in the interim.

## 2019-05-05 LAB — NOVEL CORONAVIRUS, NAA: SARS-CoV-2, NAA: NOT DETECTED

## 2019-05-12 NOTE — Progress Notes (Signed)
Remote pacemaker transmission.   

## 2019-05-15 ENCOUNTER — Ambulatory Visit (HOSPITAL_COMMUNITY): Payer: Medicare Other

## 2019-05-19 ENCOUNTER — Inpatient Hospital Stay: Payer: Medicare Other | Attending: Hematology and Oncology

## 2019-05-19 ENCOUNTER — Ambulatory Visit (HOSPITAL_COMMUNITY)
Admission: RE | Admit: 2019-05-19 | Discharge: 2019-05-19 | Disposition: A | Discharge status: Home / Self Care | Payer: Medicare Other | Source: Ambulatory Visit | Attending: Hematology and Oncology | Admitting: Hematology and Oncology

## 2019-05-19 ENCOUNTER — Telehealth: Payer: Self-pay | Admitting: Hematology and Oncology

## 2019-05-19 ENCOUNTER — Other Ambulatory Visit: Payer: Self-pay

## 2019-05-19 DIAGNOSIS — D472 Monoclonal gammopathy: Secondary | ICD-10-CM | POA: Diagnosis not present

## 2019-05-19 NOTE — Telephone Encounter (Addendum)
Called patient's daughter to discuss the results of the bone scan from today and the lab work previously. Skeletal survey is reassuring for no lytic lesions. MGUS labs are lower than the recommended bench mark for bone marrow biopsy. We will continue to monitor with labs and clinic visits, with the next in 3 months time to assure no rapid progression. If stable, this can be spaced out to q 6-12 months.  John T. Dorsey, MD Department of Hematology/Oncology [facility] at [facility] Phone: [phone] Pager: [phone] Email: [email or web address]  

## 2019-05-22 ENCOUNTER — Ambulatory Visit (HOSPITAL_COMMUNITY): Payer: Medicare Other

## 2019-05-22 LAB — UPEP/UIFE/LIGHT CHAINS/TP, 24-HR UR
% BETA, Urine: 0 %
ALPHA 1 URINE: 0 %
Albumin, U: 100 %
Alpha 2, Urine: 0 %
Free Kappa Lt Chains,Ur: 78.42 mg/L (ref 0.63–113.79)
Free Kappa/Lambda Ratio: 29.7 (ref 1.03–31.76)
Free Lambda Lt Chains,Ur: 2.64 mg/L (ref 0.47–11.77)
GAMMA GLOBULIN URINE: 0 %
Total Protein, Urine-Ur/day: 78 mg/24 hr (ref 30–150)
Total Protein, Urine: 9.2 mg/dL
Total Volume: 850

## 2019-05-30 ENCOUNTER — Telehealth: Payer: Self-pay | Admitting: Hematology and Oncology

## 2019-05-30 NOTE — Telephone Encounter (Signed)
Scheduled per 11/23 los. Called and left VM. Mailing printout

## 2019-06-06 ENCOUNTER — Telehealth: Payer: Self-pay

## 2019-06-07 ENCOUNTER — Other Ambulatory Visit: Payer: Self-pay

## 2019-06-07 ENCOUNTER — Encounter: Payer: Self-pay | Admitting: Internal Medicine

## 2019-06-07 ENCOUNTER — Telehealth: Payer: Self-pay

## 2019-06-07 ENCOUNTER — Telehealth (INDEPENDENT_AMBULATORY_CARE_PROVIDER_SITE_OTHER): Payer: Medicare Other | Admitting: Internal Medicine

## 2019-06-07 VITALS — Ht 71.0 in | Wt 170.0 lb

## 2019-06-07 DIAGNOSIS — I483 Typical atrial flutter: Secondary | ICD-10-CM

## 2019-06-07 DIAGNOSIS — I495 Sick sinus syndrome: Secondary | ICD-10-CM

## 2019-06-07 MED ORDER — APIXABAN 5 MG PO TABS: 5.0000 mg | ORAL_TABLET | Freq: Two times a day (BID) | ORAL | 3 refills | Status: DC

## 2019-06-07 NOTE — Telephone Encounter (Signed)
-----   Message from Thompson Grayer, MD sent at 06/07/2019 12:42 PM EST ----- Start eliquis 5mg  BID  ([facility])  12 months with me

## 2019-06-07 NOTE — Progress Notes (Signed)
Electrophysiology TeleHealth Note   Due to national recommendations of social distancing due to COVID 19, an audio/video telehealth visit is felt to be most appropriate for this patient at this time.  See MyChart message from today for the patient's consent to telehealth for [facility]  Date:  06/07/2019   ID:  [name], DOB [birth year], MRN [id number]  Location: patient's home  Provider location:  [facility]  Evaluation Performed: Follow-up visit  PCP:  Crist Infante, MD   Electrophysiologist:  Dr Rayann Heman  Chief Complaint:  palpitations  History of Present Illness:    [name] is a 86 y.o. male who presents via telehealth conferencing today.  Since last being seen in our clinic, the patient reports doing very well.  He is in atrial flutter since September but not aware.  Energy is good.  Today, he denies symptoms of palpitations, chest pain, shortness of breath,  lower extremity edema, dizziness, presyncope, or syncope.  The patient is otherwise without complaint today.  The patient denies symptoms of fevers, chills, cough, or new SOB worrisome for COVID 19.  Past Medical History:  Diagnosis Date  . Macular degeneration   . Osteoporosis   . Sick sinus syndrome [facility])    a. s/p MDT dual chamber PPM implant 11/2014    Past Surgical History:  Procedure Laterality Date  . PACEMAKER INSERTION  11/2014   MDT dual chamber PPM implanted for SSS     Current Outpatient Medications  Medication Sig Dispense Refill  . Ascorbic Acid (VITAMIN C) 1000 MG tablet Take 1,000 mg by mouth daily.    . cholecalciferol (VITAMIN D) 1000 UNITS tablet Take 1,000 Units by mouth daily.    . cyanocobalamin 1000 MCG tablet Take 100 mcg by mouth daily.    Marland Kitchen zolpidem (AMBIEN) 10 MG tablet Take 5 mg by mouth at bedtime as needed for sleep.   0   No current facility-administered medications for this visit.    Allergies:   Patient has no known allergies.   Social History:  The  patient  reports that he has never smoked. He has never used smokeless tobacco.   Family History:  The patient's family history includes Cancer in his brother and brother; Healthy in his daughter and son; Heart attack in his brother; Other in his mother; Stroke in his father.   ROS:  Please see the history of present illness.   All other systems are personally reviewed and negative.    Exam:    Vital Signs:  Ht 5\' 11"  (1.803 m)   Wt 170 lb (77.1 kg)   BMI 23.71 kg/m   Well sounding and appearing, alert and conversant, regular work of breathing,  good skin color Eyes- anicteric, neuro- grossly intact, skin- no apparent rash or lesions or cyanosis, mouth- oral mucosa is pink  Labs/Other Tests and Data Reviewed:    Recent Labs: 04/24/2019: ALT 13; BUN 22; Creatinine 1.22; Hemoglobin 12.6; Platelet Count 185; Potassium 4.6; Sodium 140   Wt Readings from Last 3 Encounters:  06/07/19 170 lb (77.1 kg)  04/24/19 170 lb 8 oz (77.3 kg)  06/08/18 176 lb (79.8 kg)     Last device remote is reviewed from [facility] PDF which reveals normal device function, atrial flutter since September   ASSESSMENT & PLAN:    1.  Atrial flutter New chads2vasc score is 2 Start eliquis 5mg  BID We discussed cardioversion as an option.  He is not interested at this time.  Given good rate control and paucity of symptoms, I think rate control is a good approach for him. Consider pace termination of atrial flutter if he does not return to sinus on follow-up  2. Sick sinus syndrome Remotes are uptodate Normal pacemaker function   Follow-up:  12 months with me   Patient Risk:  after full review of this patients clinical status, I feel that they are at moderate risk at this time.  Today, I have spent 20 minutes with the patient with telehealth technology discussing arrhythmia management .    Army Fossa, MD  06/07/2019 12:35 PM     [place] [address] [place]  [place] [address] [phone] (office) [phone] (fax)

## 2019-06-07 NOTE — Telephone Encounter (Signed)
Order sent in to [facility] on [address] for Eliquis 5 mg bid.  F/u appt scheduled.

## 2019-06-12 ENCOUNTER — Telehealth: Payer: Medicare Other | Admitting: Internal Medicine

## 2019-06-15 ENCOUNTER — Telehealth: Payer: Self-pay | Admitting: Internal Medicine

## 2019-06-15 NOTE — Telephone Encounter (Signed)
New Message      We are recommending the COVID-19 vaccine to all of our patients. Cardiac medications (including blood thinners) should not deter anyone from being vaccinated and there is no need to hold any of those medications prior to vaccine administration.     Currently, there is a hotline to call (active 06/09/19) to schedule vaccination appointments as no walk-ins will be accepted.   Number: [phone]    If you have further questions or concerns about the vaccine process, please visit [email or web address] or contact your primary care physician.         

## 2019-06-26 ENCOUNTER — Telehealth: Payer: Self-pay | Admitting: Internal Medicine

## 2019-06-26 NOTE — Telephone Encounter (Signed)
Spoke with patient who reports that he started Eliquis on 01/06. Last Friday, 01/22, he states that he started to have blood in his urine and it was painful for him to urinate. He stopped taking Eliquis that same day. He states that today the blood in his urine has improved. I advised him that he needs to call his primary care provider about the blood in his urine and that he should continue to take Eliquis. Patient asked me to call his daughter Manuela Schwartz. I spoke with Manuela Schwartz and gave her the same advise. She verbalized understanding and will follow up with patient's PCP.

## 2019-06-26 NOTE — Telephone Encounter (Signed)
Pt c/o medication issue:  1. Name of Medication: apixaban (ELIQUIS) 5 MG TABS tablet  2. How are you currently taking this medication (dosage and times per day)? Not currently taking medication  3. Are you having a reaction (difficulty breathing--STAT)? Yes  4. What is your medication issue? Patient began experiencing blood in urine on 06/23/19 and states it was painful to urinate. Stopped taking medication on 06/24/19 and hasn't taken it sense. Please advise.

## 2019-07-17 ENCOUNTER — Ambulatory Visit (INDEPENDENT_AMBULATORY_CARE_PROVIDER_SITE_OTHER): Payer: Medicare Other | Admitting: *Deleted

## 2019-07-17 DIAGNOSIS — I495 Sick sinus syndrome: Secondary | ICD-10-CM

## 2019-07-22 LAB — CUP PACEART REMOTE DEVICE CHECK
Battery Remaining Longevity: 40 mo
Battery Voltage: 2.98 V
Brady Statistic AP VP Percent: 53.52 %
Brady Statistic AP VS Percent: 0 %
Brady Statistic AS VP Percent: 46.19 %
Brady Statistic AS VS Percent: 0.28 %
Brady Statistic RA Percent Paced: 45.37 %
Brady Statistic RV Percent Paced: 99.43 %
Date Time Interrogation Session: 20210219170932
Implantable Lead Implant Date: 20160724
Implantable Lead Implant Date: 20160724
Implantable Lead Location: 753859
Implantable Lead Location: 753860
Implantable Lead Model: 5076
Implantable Lead Model: 5076
Implantable Pulse Generator Implant Date: 20160724
Lead Channel Impedance Value: 266 Ohm
Lead Channel Impedance Value: 323 Ohm
Lead Channel Impedance Value: 323 Ohm
Lead Channel Impedance Value: 380 Ohm
Lead Channel Pacing Threshold Amplitude: 0.625 V
Lead Channel Pacing Threshold Amplitude: 0.75 V
Lead Channel Pacing Threshold Pulse Width: 0.4 ms
Lead Channel Pacing Threshold Pulse Width: 0.4 ms
Lead Channel Sensing Intrinsic Amplitude: 1.25 mV
Lead Channel Sensing Intrinsic Amplitude: 1.25 mV
Lead Channel Sensing Intrinsic Amplitude: 6.625 mV
Lead Channel Sensing Intrinsic Amplitude: 6.625 mV
Lead Channel Setting Pacing Amplitude: 2 V
Lead Channel Setting Pacing Amplitude: 2.5 V
Lead Channel Setting Pacing Pulse Width: 0.4 ms
Lead Channel Setting Sensing Sensitivity: 0.9 mV

## 2019-07-23 NOTE — Progress Notes (Signed)
[place] [phone]   [phone]  PROGRESS NOTE  Patient Care Team: Crist Infante, MD as PCP - General (Internal Medicine)  Hematological/Oncological History #IgG Kappa Monoclonal Gammopathy of Undetermined Significance  1) 03/10/2019: M prrotein 1.2, Gamma globulin 3.5, IgG 2132 (elevated).  Cr 1.4, Ca 8.3, Hgb 11.5 2) 04/24/2019: establish care with Dr. Lorenso Courier. M protein 1.1, Kappa 40.2, Lambda 27.4, ratio 1.47. Ca 8.7, Cr 1.22, Hgb 12.6  Interval History:  [name] 86 y.o. male with medical history significant for MGUS presents for a follow up visit. The patient's last visit was on 04/24/2019. In the interim since the last visit [name] has developed a rash on his face which is currently being followed by dermatology.  Additionally he is started apixaban therapy due to his atrial fibrillation.  On exam today [name] notes that he feels well.  He reports that he has developed an erythematous and uncomfortable rash on his face which is currently under evaluation by dermatology.  They provided him with a cream with which to apply to his face and he notes that there has not yet been improvement with this.  He does have some crusting on either side of the nose as well as erythema that extends up to the hairline bilaterally.  Additionally he has recently started taking apixaban therapy for his atrial fibrillation.  He reports that he is tolerated the medication well with no nosebleeds, increased bruising, or dark stools.  He does have a baseline level of bruising with senile purpura on his bilateral extremities however this is stable.  On further discussion he notes that his energy level is good and that his appetite is stable.  He reports that he is able to work in the yard much as he desired.  He reports that he has completed both shots of his Covid vaccine regimen.  Overall he has no additional issues or concerns today.  A full 10 point ROS is  listed below.  MEDICAL HISTORY:  Past Medical History:  Diagnosis Date  . Macular degeneration   . Osteoporosis   . Sick sinus syndrome [facility])    a. s/p MDT dual chamber PPM implant 11/2014    SURGICAL HISTORY: Past Surgical History:  Procedure Laterality Date  . PACEMAKER INSERTION  11/2014   MDT dual chamber PPM implanted for SSS     SOCIAL HISTORY: Social History   Socioeconomic History  . Marital status: Widowed    Spouse name: Not on file  . Number of children: Not on file  . Years of education: Not on file  . Highest education level: Not on file  Occupational History  . Not on file  Tobacco Use  . Smoking status: Never Smoker  . Smokeless tobacco: Never Used  Substance and Sexual Activity  . Alcohol use: Not on file  . Drug use: Not on file  . Sexual activity: Not on file  Other Topics Concern  . Not on file  Social History Narrative  . Not on file   Social Determinants of Health   Financial Resource Strain:   . Difficulty of Paying Living Expenses: Not on file  Food Insecurity:   . Worried About Charity fundraiser in the Last Year: Not on file  . Ran Out of Food in the Last Year: Not on file  Transportation Needs:   . Lack of Transportation (Medical): Not on file  . Lack of Transportation (Non-Medical): Not on file  Physical Activity:   .  Days of Exercise per Week: Not on file  . Minutes of Exercise per Session: Not on file  Stress:   . Feeling of Stress : Not on file  Social Connections:   . Frequency of Communication with Friends and Family: Not on file  . Frequency of Social Gatherings with Friends and Family: Not on file  . Attends Religious Services: Not on file  . Active Member of Clubs or Organizations: Not on file  . Attends Archivist Meetings: Not on file  . Marital Status: Not on file  Intimate Partner Violence:   . Fear of Current or Ex-Partner: Not on file  . Emotionally Abused: Not on file  . Physically Abused: Not on file   . Sexually Abused: Not on file    FAMILY HISTORY: Family History  Problem Relation Age of Onset  . Other Mother        OLD AGE  . Stroke Father   . Heart attack Brother   . Cancer Brother   . Cancer Brother   . Healthy Son   . Healthy Daughter     ALLERGIES:  has No Known Allergies.  MEDICATIONS:  Current Outpatient Medications  Medication Sig Dispense Refill  . apixaban (ELIQUIS) 5 MG TABS tablet Take 1 tablet (5 mg total) by mouth 2 (two) times daily. 180 tablet 3  . Ascorbic Acid (VITAMIN C) 1000 MG tablet Take 1,000 mg by mouth daily.    . cholecalciferol (VITAMIN D) 1000 UNITS tablet Take 1,000 Units by mouth daily.    . cyanocobalamin 1000 MCG tablet Take 1,000 mcg by mouth daily.     Marland Kitchen zolpidem (AMBIEN) 10 MG tablet Take 5 mg by mouth at bedtime as needed for sleep.   0   No current facility-administered medications for this visit.    REVIEW OF SYSTEMS:   Constitutional: ( - ) fevers, ( - )  chills , ( - ) night sweats Eyes: ( - ) blurriness of vision, ( - ) double vision, ( - ) watery eyes Ears, nose, mouth, throat, and face: ( - ) mucositis, ( - ) sore throat Respiratory: ( - ) cough, ( - ) dyspnea, ( - ) wheezes Cardiovascular: ( - ) palpitation, ( - ) chest discomfort, ( - ) lower extremity swelling Gastrointestinal:  ( - ) nausea, ( - ) heartburn, ( - ) change in bowel habits Skin: ( - ) abnormal skin rashes Lymphatics: ( - ) new lymphadenopathy, ( - ) easy bruising Neurological: ( - ) numbness, ( - ) tingling, ( - ) new weaknesses Behavioral/Psych: ( - ) mood change, ( - ) new changes  All other systems were reviewed with the patient and are negative.  PHYSICAL EXAMINATION: ECOG PERFORMANCE STATUS: 1 - Symptomatic but completely ambulatory  Vitals:   07/24/19 1102  BP: 135/84  Pulse: 88  Resp: 17  Temp: 98.5 F (36.9 C)  SpO2: 97%   Filed Weights   07/24/19 1102  Weight: 171 lb 9.6 oz (77.8 kg)    GENERAL: alert, no distress and  comfortable SKIN: erythematous rash with crusting around the nose. Extends from ear to ear and to the hairline. Spares the forehead and neck.  EYES: conjunctiva are pink and non-injected, sclera clear LUNGS: clear to auscultation and percussion with normal breathing effort HEART: regular rate & rhythm and no murmurs and no lower extremity edema Musculoskeletal: no cyanosis of digits and no clubbing  PSYCH: alert & oriented x 3, fluent speech  NEURO: no focal motor/sensory deficits  LABORATORY DATA:  I have reviewed the data as listed CBC Latest Ref Rng & Units 07/24/2019 04/24/2019 08/11/2007  WBC 4.0 - 10.5 K/uL 12.0(H) 10.0 12.3(H)  Hemoglobin 13.0 - 17.0 g/dL 12.7(L) 12.6(L) 9.8(L)  Hematocrit 39.0 - 52.0 % 38.8(L) 39.4 27.9(L)  Platelets 150 - 400 K/uL 191 185 155    CMP Latest Ref Rng & Units 07/24/2019 04/24/2019 08/10/2007  Glucose 70 - 99 mg/dL 96 93 126(H)  BUN 8 - 23 mg/dL '20 22 12  ' Creatinine 0.61 - 1.24 mg/dL 1.32(H) 1.22 1.01  Sodium 135 - 145 mmol/L 141 140 136  Potassium 3.5 - 5.1 mmol/L 4.4 4.6 3.8  Chloride 98 - 111 mmol/L 106 105 104  CO2 22 - 32 mmol/L '26 26 29  ' Calcium 8.9 - 10.3 mg/dL 9.0 8.7(L) 7.8(L)  Total Protein 6.5 - 8.1 g/dL 8.1 7.5 -  Total Bilirubin 0.3 - 1.2 mg/dL 0.8 0.7 -  Alkaline Phos 38 - 126 U/L 62 56 -  AST 15 - 41 U/L 20 20 -  ALT 0 - 44 U/L 15 13 -    RADIOGRAPHIC STUDIES: I have personally reviewed the radiological images as listed and agreed with the findings in the report. CUP PACEART REMOTE DEVICE CHECK  Result Date: 07/22/2019 Scheduled pacemaker remote.  Normal device function. Presenting AF, episode in progress since 06/08/19, V rates controlled.  EMR med list includes Eliquis. NSVT episodes previously reported.  Routed to EM for presenting AF. [place] 86 y.o. male with medical history significant for MGUS presents for a follow up visit.   After review the labs and discussion with the patient it  appears that his clinical status and labs are stable.  As such I would recommend continued monitoring for his IgG kappa monoclonal gammopathy of undetermined significance.  There is no clear indication for a bone marrow biopsy at this time given the stability of his labs. DG Bone survey is reassuring with on clear lytic lesions.  I would recommend the patient follow-up with [place] in 6 months time for continued close monitoring of this M protein.  #IgG Kappa Monoclonal Gammopathy of Undetermined Significance --will order repeat SPEP, SFLC, beta-2-microglobulin today --repeat CMP and CBC to be ordered today --will order a bone survey yearly. Last on 05/19/2019.  --no clear indication for a bone marrow biopsy at this time based on prior labs --RTC in 6 months time with earlier return if concerning abnormality is noted in his above labs/imaging.    Orders Placed This Encounter  Procedures  . CBC with Differential (Cancer Center Only)    Standing Status:   Future    Number of Occurrences:   1    Standing Expiration Date:   07/23/2020  . CMP ([place] only)    Standing Status:   Future    Number of Occurrences:   1    Standing Expiration Date:   07/23/2020  . Lactate dehydrogenase (LDH)    Standing Status:   Future    Number of Occurrences:   1    Standing Expiration Date:   07/23/2020  . Multiple Myeloma Panel (SPEP&IFE w/QIG)    Standing Status:   Future    Number of Occurrences:   1    Standing Expiration Date:   07/23/2020  . 24-Hr Ur UPEP/UIFE/Light Chains/TP    Standing Status:   Future    Standing Expiration Date:   07/23/2020  .  Kappa/lambda light chains    Standing Status:   Future    Number of Occurrences:   1    Standing Expiration Date:   07/23/2020  . Beta 2 microglobulin    Standing Status:   Future    Number of Occurrences:   1    Standing Expiration Date:   07/23/2020    All questions were answered. The patient knows to call the clinic with any problems, questions or  concerns.  A total of more than 20 minutes were spent on this encounter and over half of that time was spent on counseling and coordination of care as outlined above.   Ledell Peoples, MD Department of Hematology/Oncology [place] [facility] Phone: [phone] Pager: [phone] Email: [email or web address]  07/25/2019 1:26 PM    Literature Support:  1) Kyle RA, Durie BG, Rajkumar SV, et al. Monoclonal gammopathy of undetermined significance (MGUS) and smoldering (asymptomatic) multiple myeloma: IMWG consensus perspectives risk factors for progression and guidelines for monitoring and management. Leukemia. 2010;24(6):1121-1127. doi:10.1038/leu.2010.60   --If a patient with apparent MGUS has a serum monoclonal protein >15 g/l, IgA or IgM protein type, or an abnormal FLC ratio, a BM aspirate and biopsy should be carried out at baseline to rule out underlying PC malignancy.   2) Marylyn Ishihara RA, Therneau TM, Rajkumar SV, et al. Prevalence of monoclonal gammopathy of undetermined significance. N Engl J Med 2751;700:1749-4496   --MGUS was found in 3.2 percent of persons 69 years of age or older and 5.3 percent of persons 37 years of age or older.

## 2019-07-24 ENCOUNTER — Telehealth: Payer: Self-pay | Admitting: Internal Medicine

## 2019-07-24 ENCOUNTER — Inpatient Hospital Stay: Payer: Medicare Other | Attending: Hematology and Oncology | Admitting: Hematology and Oncology

## 2019-07-24 ENCOUNTER — Inpatient Hospital Stay: Payer: Medicare Other

## 2019-07-24 ENCOUNTER — Other Ambulatory Visit: Payer: Self-pay

## 2019-07-24 VITALS — BP 135/84 | HR 88 | Temp 98.5°F | Resp 17 | Ht 71.0 in | Wt 171.6 lb

## 2019-07-24 DIAGNOSIS — D472 Monoclonal gammopathy: Secondary | ICD-10-CM

## 2019-07-24 DIAGNOSIS — Z79899 Other long term (current) drug therapy: Secondary | ICD-10-CM | POA: Diagnosis not present

## 2019-07-24 DIAGNOSIS — Z950 Presence of cardiac pacemaker: Secondary | ICD-10-CM | POA: Diagnosis not present

## 2019-07-24 DIAGNOSIS — M810 Age-related osteoporosis without current pathological fracture: Secondary | ICD-10-CM | POA: Diagnosis not present

## 2019-07-24 DIAGNOSIS — Z7901 Long term (current) use of anticoagulants: Secondary | ICD-10-CM | POA: Insufficient documentation

## 2019-07-24 DIAGNOSIS — R21 Rash and other nonspecific skin eruption: Secondary | ICD-10-CM | POA: Insufficient documentation

## 2019-07-24 LAB — CMP (CANCER CENTER ONLY)
ALT: 15 U/L (ref 0–44)
AST: 20 U/L (ref 15–41)
Albumin: 3.6 g/dL (ref 3.5–5.0)
Alkaline Phosphatase: 62 U/L (ref 38–126)
Anion gap: 9 (ref 5–15)
BUN: 20 mg/dL (ref 8–23)
CO2: 26 mmol/L (ref 22–32)
Calcium: 9 mg/dL (ref 8.9–10.3)
Chloride: 106 mmol/L (ref 98–111)
Creatinine: 1.32 mg/dL — ABNORMAL HIGH (ref 0.61–1.24)
GFR, Est AFR Am: 54 mL/min — ABNORMAL LOW (ref >60)
GFR, Estimated: 46 mL/min — ABNORMAL LOW (ref >60)
Glucose, Bld: 96 mg/dL (ref 70–99)
Potassium: 4.4 mmol/L (ref 3.5–5.1)
Sodium: 141 mmol/L (ref 135–145)
Total Bilirubin: 0.8 mg/dL (ref 0.3–1.2)
Total Protein: 8.1 g/dL (ref 6.5–8.1)

## 2019-07-24 LAB — CBC WITH DIFFERENTIAL (CANCER CENTER ONLY)
Abs Immature Granulocytes: 0.03 10*3/uL (ref 0.00–0.07)
Basophils Absolute: 0.1 10*3/uL (ref 0.0–0.1)
Basophils Relative: 1 %
Eosinophils Absolute: 0.2 10*3/uL (ref 0.0–0.5)
Eosinophils Relative: 1 %
HCT: 38.8 % — ABNORMAL LOW (ref 39.0–52.0)
Hemoglobin: 12.7 g/dL — ABNORMAL LOW (ref 13.0–17.0)
Immature Granulocytes: 0 %
Lymphocytes Relative: 35 %
Lymphs Abs: 4.2 10*3/uL — ABNORMAL HIGH (ref 0.7–4.0)
MCH: 31.4 pg (ref 26.0–34.0)
MCHC: 32.7 g/dL (ref 30.0–36.0)
MCV: 96 fL (ref 80.0–100.0)
Monocytes Absolute: 1.1 10*3/uL — ABNORMAL HIGH (ref 0.1–1.0)
Monocytes Relative: 9 %
Neutro Abs: 6.4 10*3/uL (ref 1.7–7.7)
Neutrophils Relative %: 54 %
Platelet Count: 191 10*3/uL (ref 150–400)
RBC: 4.04 MIL/uL — ABNORMAL LOW (ref 4.22–5.81)
RDW: 15.3 % (ref 11.5–15.5)
WBC Count: 12 10*3/uL — ABNORMAL HIGH (ref 4.0–10.5)
nRBC: 0 % (ref 0.0–0.2)

## 2019-07-24 LAB — LACTATE DEHYDROGENASE: LDH: 164 U/L (ref 98–192)

## 2019-07-24 NOTE — Telephone Encounter (Signed)
Returned call to daughter.  She had questions regarding the remote check.  Discussed results of remote check.  Pt is taking Eliquis.  Daughter thinks he passed a stone previously when he was having blood in urine.  That is resolved and Pt is now taking Eliquis.  Advised Pt is in afib but controlled rate. Protected now on Eliquis.    Daughter thanked Marine scientist for call. No further questions.

## 2019-07-24 NOTE — Telephone Encounter (Signed)
Follow up:    Patient daughter would like for you to give a call back.

## 2019-07-25 ENCOUNTER — Encounter: Payer: Self-pay | Admitting: Hematology and Oncology

## 2019-07-25 LAB — BETA 2 MICROGLOBULIN, SERUM: Beta-2 Microglobulin: 4 mg/L — ABNORMAL HIGH (ref 0.6–2.4)

## 2019-07-25 LAB — KAPPA/LAMBDA LIGHT CHAINS
Kappa free light chain: 41.6 mg/L — ABNORMAL HIGH (ref 3.3–19.4)
Kappa, lambda light chain ratio: 1.31 (ref 0.26–1.65)
Lambda free light chains: 31.8 mg/L — ABNORMAL HIGH (ref 5.7–26.3)

## 2019-07-26 DIAGNOSIS — D472 Monoclonal gammopathy: Secondary | ICD-10-CM | POA: Diagnosis not present

## 2019-07-26 LAB — MULTIPLE MYELOMA PANEL, SERUM
Albumin SerPl Elph-Mcnc: 3.4 g/dL (ref 2.9–4.4)
Albumin/Glob SerPl: 1 (ref 0.7–1.7)
Alpha 1: 0.2 g/dL (ref 0.0–0.4)
Alpha2 Glob SerPl Elph-Mcnc: 0.6 g/dL (ref 0.4–1.0)
B-Globulin SerPl Elph-Mcnc: 0.8 g/dL (ref 0.7–1.3)
Gamma Glob SerPl Elph-Mcnc: 2.1 g/dL — ABNORMAL HIGH (ref 0.4–1.8)
Globulin, Total: 3.7 g/dL (ref 2.2–3.9)
IgA: 246 mg/dL (ref 61–437)
IgG (Immunoglobin G), Serum: 2293 mg/dL — ABNORMAL HIGH (ref 603–1613)
IgM (Immunoglobulin M), Srm: 35 mg/dL (ref 15–143)
M Protein SerPl Elph-Mcnc: 1.2 g/dL — ABNORMAL HIGH
Total Protein ELP: 7.1 g/dL (ref 6.0–8.5)

## 2019-07-28 ENCOUNTER — Telehealth: Payer: Self-pay | Admitting: *Deleted

## 2019-07-28 LAB — UPEP/UIFE/LIGHT CHAINS/TP, 24-HR UR
% BETA, Urine: 26.3 %
ALPHA 1 URINE: 3.6 %
Albumin, U: 23.2 %
Alpha 2, Urine: 16.9 %
Free Kappa Lt Chains,Ur: 56.76 mg/L (ref 0.63–113.79)
Free Kappa/Lambda Ratio: 25.45 (ref 1.03–31.76)
Free Lambda Lt Chains,Ur: 2.23 mg/L (ref 0.47–11.77)
GAMMA GLOBULIN URINE: 29.9 %
M-SPIKE %, Urine: 14.8 % — ABNORMAL HIGH
M-Spike, Mg/24 Hr: 9 mg/24 hr — ABNORMAL HIGH
Total Protein, Urine-Ur/day: 58 mg/24 hr (ref 30–150)
Total Protein, Urine: 8.3 mg/dL
Total Volume: 700

## 2019-07-28 NOTE — Telephone Encounter (Signed)
-----   Message from [name], MD sent at 07/28/2019 11:53 AM EST ----- Please let Mr. [name] know that his MGUS bloodwork looked stable from prior. We will have him return to clinic in 6 months to recheck. We do not have his urine studies yet, but we will call him if there are any concerning abnormalities there.  [name]  ----- Message ----- From: Buel Ream, Lab In [place] Sent: 07/24/2019  12:30 PM EST To: [name], MD

## 2019-07-28 NOTE — Telephone Encounter (Signed)
Received vm message from pt's daughter, [place]. She is requesting review and interpretation of pt's recent labs (07/24/19) as they were available on My Chart.  Her # is [phone]  Please advise

## 2019-07-28 NOTE — Telephone Encounter (Signed)
Attempted call back to pt's daughter. No answer and her vm mailbox was full, unable to leave message regarding her father's lab results. MGUS blood work is stable, compared to previous studies. He is to return to the clinic in 6 months. His urine studies are not completed yet, but we will call if there is anything concerning about those results.

## 2019-08-02 ENCOUNTER — Telehealth: Payer: Self-pay

## 2019-08-02 NOTE — Telephone Encounter (Signed)
-----   Message from Otila Kluver, RN sent at 08/02/2019  2:49 PM EST -----  ----- Message ----- From: [name], MD Sent: 08/02/2019  11:29 AM EST To: Otila Kluver, RN  Please call Mr. Abell to let him know his MGUS labs are stable. We will plan to see him back in approximately 6 months to repeat these studies.   [name]  ----- Message ----- From: Buel Ream, Lab In [place] Sent: 07/24/2019  12:30 PM EST To: [name], MD

## 2019-08-02 NOTE — Telephone Encounter (Signed)
TCT patient no response. LVM to call back for results from his Lab.

## 2019-08-03 NOTE — Telephone Encounter (Signed)
Received call back from patient regarding lab results.  Reviewed labs with him and advised that his MGUS labs are stable. Dr. Lorenso Courier would like to see him again in 6 months.  Pt voiced understanding.

## 2019-10-03 ENCOUNTER — Telehealth: Payer: Self-pay | Admitting: Internal Medicine

## 2019-10-03 NOTE — Telephone Encounter (Signed)
Pt c/o medication issue:  1. Name of Medication: apixaban (ELIQUIS) 5 MG TABS tablet  2. How are you currently taking this medication (dosage and times per day)? As directed  3. Are you having a reaction (difficulty breathing--STAT)? nosebleed  4. What is your medication issue? Pt has gotten nosebleeds at night when he is in bed laying down. They do not last long (~10 min) but then they stop.   The daughter wants to know if he should continue taking his eliquis as directed or to stop taking it temporarily. If the daughter does not pick up please leave a detailed message on her answering machine

## 2019-10-03 NOTE — Telephone Encounter (Signed)
Patient's daughter called I advised her that Dr. Jackalyn Lombard nurse sent a message through my chart.  Patient was on phone.  I read message to him, he verbalized understanding.

## 2019-10-03 NOTE — Telephone Encounter (Signed)
I returned call to patient's daughter and left message that I was calling to get more information. I asked her to call office

## 2019-10-16 ENCOUNTER — Ambulatory Visit (INDEPENDENT_AMBULATORY_CARE_PROVIDER_SITE_OTHER): Payer: Medicare Other | Admitting: *Deleted

## 2019-10-16 DIAGNOSIS — I495 Sick sinus syndrome: Secondary | ICD-10-CM | POA: Diagnosis not present

## 2019-10-17 ENCOUNTER — Telehealth: Payer: Self-pay

## 2019-10-17 LAB — CUP PACEART REMOTE DEVICE CHECK
Battery Remaining Longevity: 40 mo
Battery Voltage: 2.98 V
Brady Statistic AP VP Percent: 0.2 %
Brady Statistic AP VS Percent: 0 %
Brady Statistic AS VP Percent: 99.42 %
Brady Statistic AS VS Percent: 0.38 %
Brady Statistic RA Percent Paced: 0.11 %
Brady Statistic RV Percent Paced: 99.65 %
Date Time Interrogation Session: 20210518113111
Implantable Lead Implant Date: 20160724
Implantable Lead Implant Date: 20160724
Implantable Lead Location: 753859
Implantable Lead Location: 753860
Implantable Lead Model: 5076
Implantable Lead Model: 5076
Implantable Pulse Generator Implant Date: 20160724
Lead Channel Impedance Value: 285 Ohm
Lead Channel Impedance Value: 323 Ohm
Lead Channel Impedance Value: 342 Ohm
Lead Channel Impedance Value: 380 Ohm
Lead Channel Pacing Threshold Amplitude: 0.625 V
Lead Channel Pacing Threshold Amplitude: 0.625 V
Lead Channel Pacing Threshold Pulse Width: 0.4 ms
Lead Channel Pacing Threshold Pulse Width: 0.4 ms
Lead Channel Sensing Intrinsic Amplitude: 0.625 mV
Lead Channel Sensing Intrinsic Amplitude: 0.625 mV
Lead Channel Sensing Intrinsic Amplitude: 8.25 mV
Lead Channel Sensing Intrinsic Amplitude: 8.25 mV
Lead Channel Setting Pacing Amplitude: 2 V
Lead Channel Setting Pacing Amplitude: 2.5 V
Lead Channel Setting Pacing Pulse Width: 0.4 ms
Lead Channel Setting Sensing Sensitivity: 0.9 mV

## 2019-10-17 NOTE — Telephone Encounter (Signed)
Left message for patient to remind of missed remote transmission.  

## 2019-10-18 NOTE — Progress Notes (Signed)
Remote pacemaker transmission.   

## 2019-12-27 ENCOUNTER — Telehealth: Payer: Self-pay

## 2019-12-27 NOTE — Telephone Encounter (Signed)
My dad started taking Eliquis in Jan. His first 2 prescriptions ran around $146 for a 90 day supply.  Last month it jumped up to $346.  He has [facility] and was told it moved from tier 1 to tier 3 pricing.   He is concerned about the pricing and wondering if there is another similar drug that would work as well but as expensive.    But if this is the best medication for him, that is what is most important.  Elwyn Lade, Daughter [phone]

## 2019-12-27 NOTE — Telephone Encounter (Signed)
**Note De-Identified Morganna Styles Obfuscation** I called and left a detailed message on Susan's (pts daughter and DPR) VM advising her to contact [facility] Pt Asst program at [phone] as I appears the pt has fallen into his "Ins gap" or "donut hole" based on cost increase per Manuela Schwartz. I did leave my name and the office phone number so she can call back if she has questions.

## 2020-01-15 ENCOUNTER — Telehealth: Payer: Self-pay | Admitting: Hematology and Oncology

## 2020-01-15 ENCOUNTER — Ambulatory Visit (INDEPENDENT_AMBULATORY_CARE_PROVIDER_SITE_OTHER): Payer: Medicare Other | Admitting: *Deleted

## 2020-01-15 DIAGNOSIS — I495 Sick sinus syndrome: Secondary | ICD-10-CM | POA: Diagnosis not present

## 2020-01-15 LAB — CUP PACEART REMOTE DEVICE CHECK
Battery Remaining Longevity: 34 mo
Battery Voltage: 2.97 V
Brady Statistic RA Percent Paced: 0.22 %
Brady Statistic RV Percent Paced: 99.07 %
Date Time Interrogation Session: 20210816103302
Implantable Lead Implant Date: 20160724
Implantable Lead Implant Date: 20160724
Implantable Lead Location: 753859
Implantable Lead Location: 753860
Implantable Lead Model: 5076
Implantable Lead Model: 5076
Implantable Pulse Generator Implant Date: 20160724
Lead Channel Impedance Value: 285 Ohm
Lead Channel Impedance Value: 323 Ohm
Lead Channel Impedance Value: 342 Ohm
Lead Channel Impedance Value: 380 Ohm
Lead Channel Pacing Threshold Amplitude: 0.625 V
Lead Channel Pacing Threshold Amplitude: 0.625 V
Lead Channel Pacing Threshold Pulse Width: 0.4 ms
Lead Channel Pacing Threshold Pulse Width: 0.4 ms
Lead Channel Sensing Intrinsic Amplitude: 0.5 mV
Lead Channel Sensing Intrinsic Amplitude: 0.5 mV
Lead Channel Sensing Intrinsic Amplitude: 10.5 mV
Lead Channel Sensing Intrinsic Amplitude: 10.5 mV
Lead Channel Setting Pacing Amplitude: 2 V
Lead Channel Setting Pacing Amplitude: 2.5 V
Lead Channel Setting Pacing Pulse Width: 0.4 ms
Lead Channel Setting Sensing Sensitivity: 0.9 mV

## 2020-01-15 NOTE — Telephone Encounter (Signed)
R/s 8/23 per Provider call day. Moved to 8/30. Called and left msg.

## 2020-01-16 NOTE — Progress Notes (Signed)
Remote pacemaker transmission.   

## 2020-01-22 ENCOUNTER — Inpatient Hospital Stay: Payer: Medicare Other | Admitting: Hematology and Oncology

## 2020-01-22 ENCOUNTER — Inpatient Hospital Stay: Payer: Medicare Other

## 2020-01-29 ENCOUNTER — Ambulatory Visit: Payer: Medicare Other | Admitting: Hematology and Oncology

## 2020-01-29 ENCOUNTER — Other Ambulatory Visit: Payer: Medicare Other

## 2020-03-12 IMAGING — DX DG BONE SURVEY MET
9 of 10 series · 9 of 10 positions shown · non-contrast
Comparison: Chest two-view 08/10/2007

CLINICAL DATA: Monoclonal gammopathy

EXAM:
METASTATIC BONE SURVEY

[skull lat]
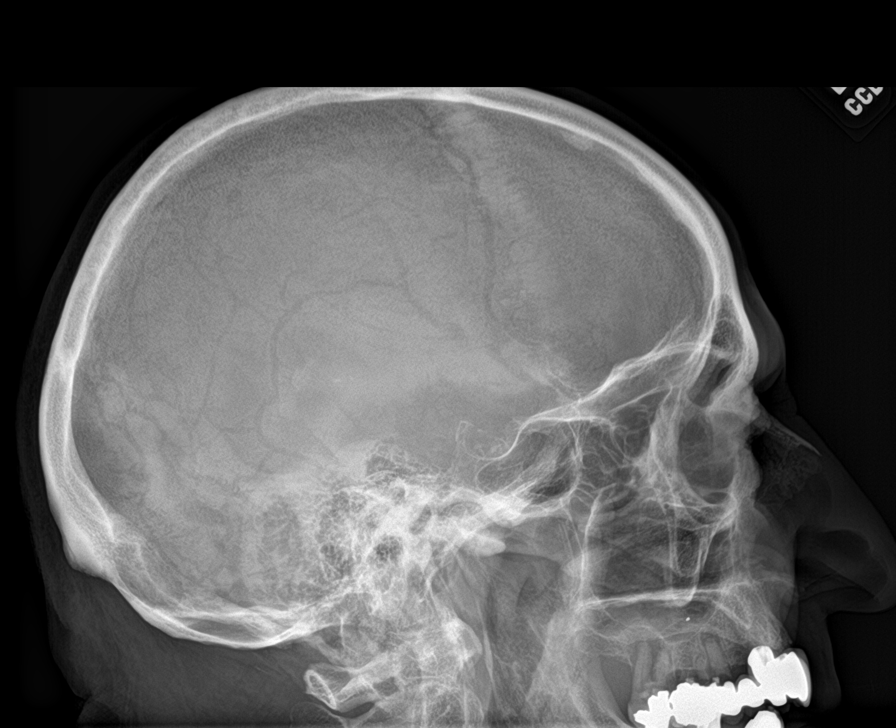

[shoulder ap (1 of 2)]
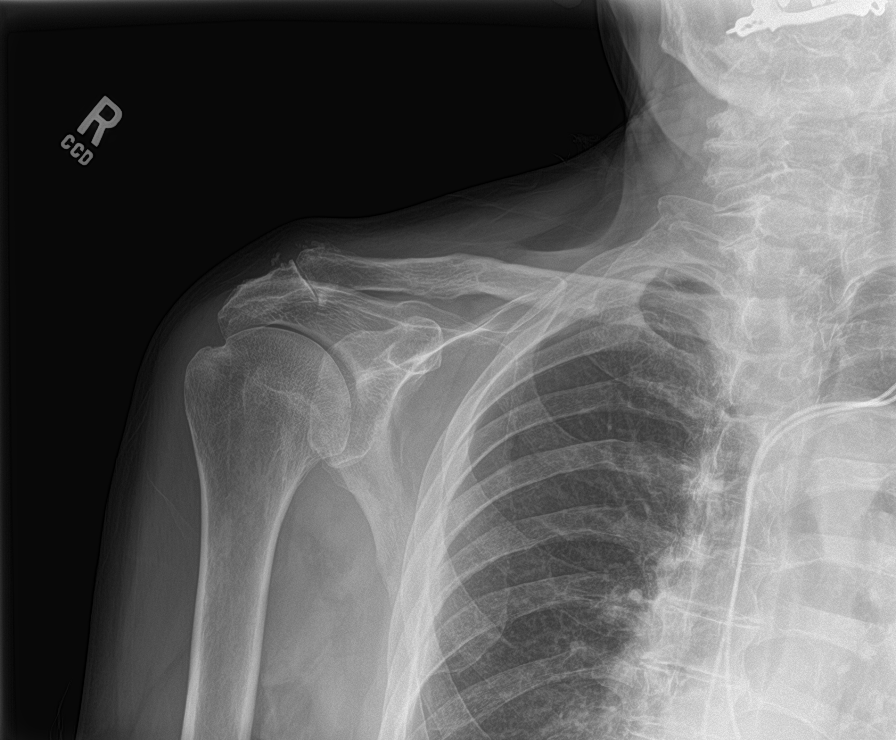

[shoulder ap (2 of 2)]
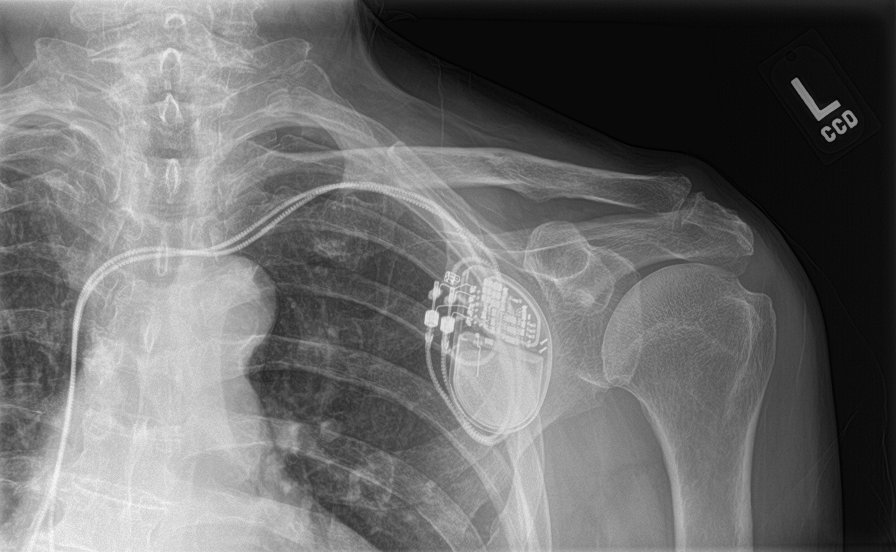

[humerus ap (1 of 2)]
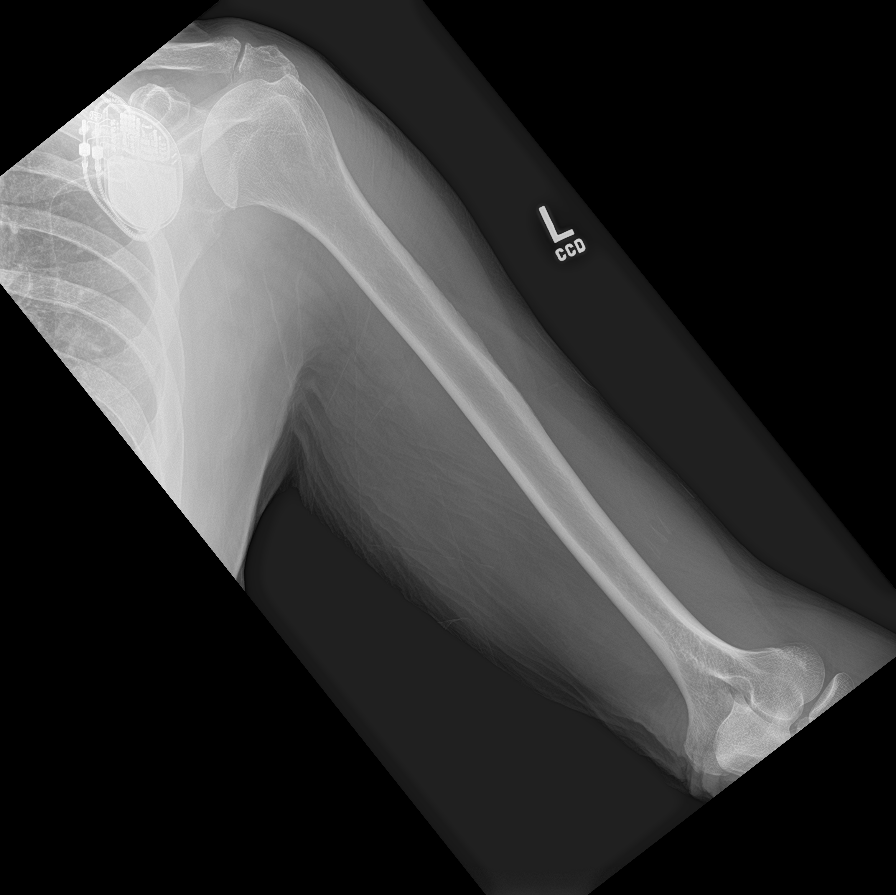

[humerus ap (2 of 2)]
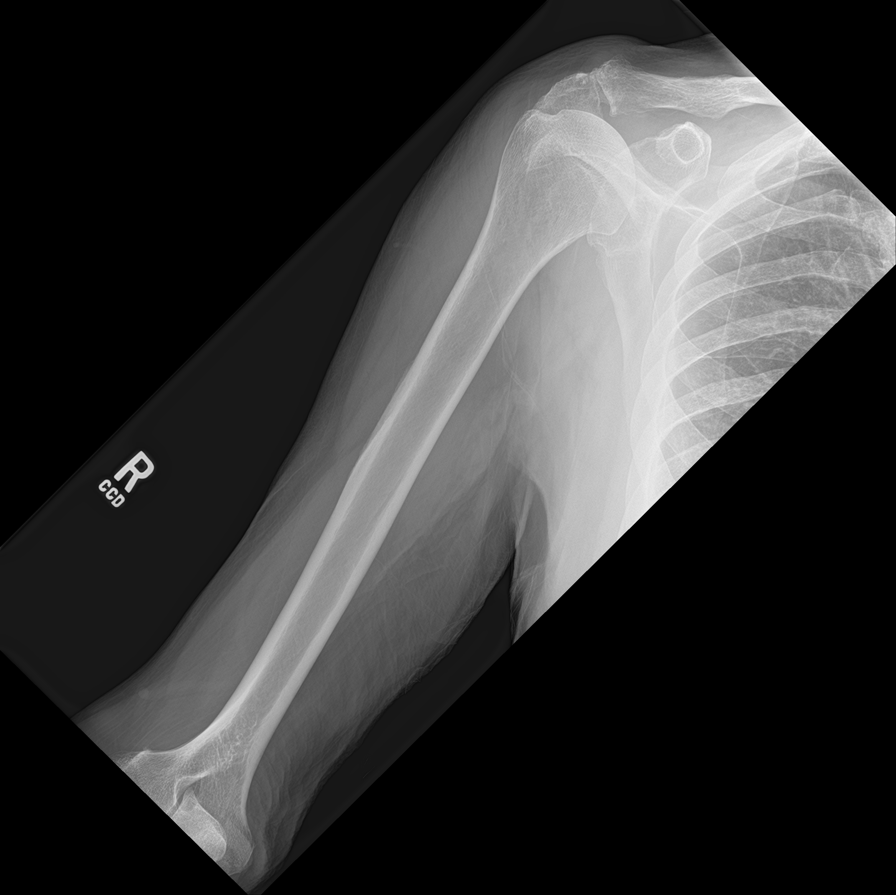

[forearm ap (1 of 2)]
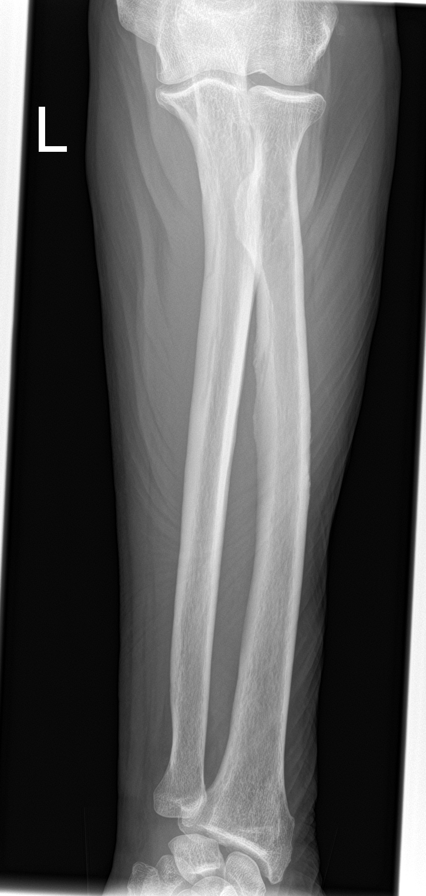

[forearm ap (2 of 2)]
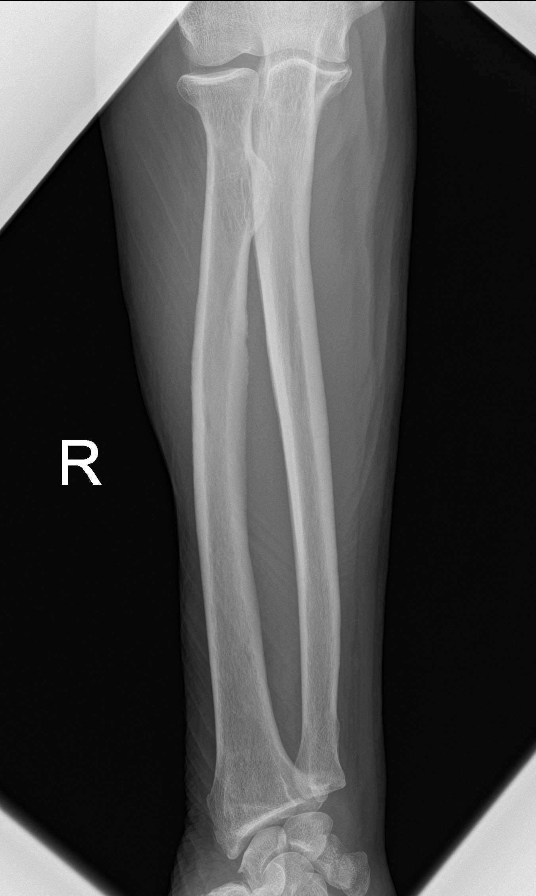

[c-spine ap]
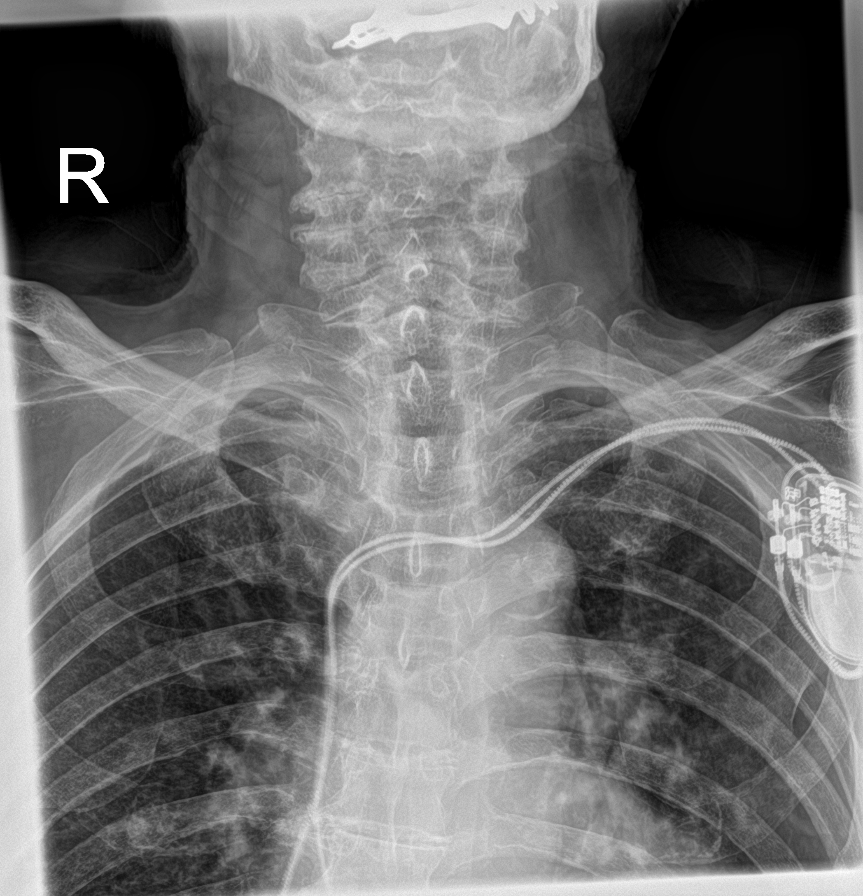

[c-spine lat]
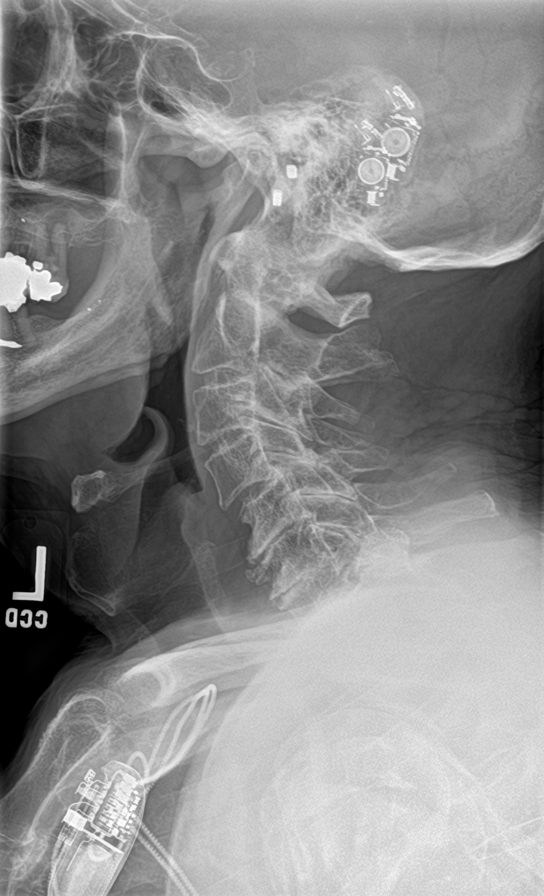

[9 of 10 positions shown; findings below may reference images not displayed]

FINDINGS: No lytic bone lesions are seen consistent with myeloma. No acute
fracture.

Left subclavian transvenous pacemaker.

Cervical spondylosis. Thoracic degenerative change. Lumbar scoliosis
convex to the right with multilevel degenerative change.

Chronic compression fracture T4 unchanged from prior chest x-ray

Multiple rounded calcifications overlying the bladder likely bladder
calculi.

Bilateral knee replacement.
IMPRESSION: No lytic lesions consistent with multiple myeloma. No acute skeletal
abnormality.

Numerous bladder calculi.

## 2020-03-25 ENCOUNTER — Telehealth: Payer: Self-pay | Admitting: Internal Medicine

## 2020-03-25 DIAGNOSIS — R040 Epistaxis: Secondary | ICD-10-CM

## 2020-03-25 NOTE — Telephone Encounter (Signed)
Spoke with Daughter about Eliquis and importance of him continuing to take this medication, he did not take it after his first nose bleed the next day or since then. Missed ~2-3 doses. Advised about saline spray after the nose bleed is controlled and not during the bleed, using a humidifier every night and possible daytime if able, stopping any cold or allergy medications that may be drying out his nose, avoiding blowing his nose. After implementing these changes if he still is having nose bleeds to contact an ENT doctor.   Advised again importance of taking Eliquis. Daughter verbalized understand.

## 2020-03-25 NOTE — Telephone Encounter (Signed)
I would suggest that he use a saline spray to make sure nostrils stay moist.  Make sure he isn't taking any cold/allergy medications that might be drying his nose out Avoid over blowing his nose Should see ENT if continues to have nose bleeds

## 2020-03-25 NOTE — Telephone Encounter (Signed)
Patient's daughter states early Saturday (03/23/20) morning the patient began having nose bleeds. The first nose bleed lasted about 30 minutes, on and off. Patient has had 2 additional nose bleeds since Saturday morning. Please call to discuss.

## 2020-03-26 NOTE — Telephone Encounter (Signed)
Patient's daughter requesting a referral to an [place] [facility] Throat doctor.

## 2020-03-29 ENCOUNTER — Ambulatory Visit: Payer: Medicare Other

## 2020-04-11 ENCOUNTER — Other Ambulatory Visit: Payer: Self-pay | Admitting: Internal Medicine

## 2020-04-11 DIAGNOSIS — R5381 Other malaise: Secondary | ICD-10-CM

## 2020-04-15 ENCOUNTER — Ambulatory Visit (INDEPENDENT_AMBULATORY_CARE_PROVIDER_SITE_OTHER): Payer: Medicare Other

## 2020-04-15 DIAGNOSIS — I495 Sick sinus syndrome: Secondary | ICD-10-CM

## 2020-04-17 LAB — CUP PACEART REMOTE DEVICE CHECK
Battery Remaining Longevity: 31 mo
Battery Voltage: 2.97 V
Brady Statistic RA Percent Paced: 0.37 %
Brady Statistic RV Percent Paced: 98.96 %
Date Time Interrogation Session: 20211116104003
Implantable Lead Implant Date: 20160724
Implantable Lead Implant Date: 20160724
Implantable Lead Location: 753859
Implantable Lead Location: 753860
Implantable Lead Model: 5076
Implantable Lead Model: 5076
Implantable Pulse Generator Implant Date: 20160724
Lead Channel Impedance Value: 266 Ohm
Lead Channel Impedance Value: 304 Ohm
Lead Channel Impedance Value: 304 Ohm
Lead Channel Impedance Value: 342 Ohm
Lead Channel Pacing Threshold Amplitude: 0.625 V
Lead Channel Pacing Threshold Amplitude: 0.625 V
Lead Channel Pacing Threshold Pulse Width: 0.4 ms
Lead Channel Pacing Threshold Pulse Width: 0.4 ms
Lead Channel Sensing Intrinsic Amplitude: 0.5 mV
Lead Channel Sensing Intrinsic Amplitude: 0.5 mV
Lead Channel Sensing Intrinsic Amplitude: 7.625 mV
Lead Channel Sensing Intrinsic Amplitude: 7.625 mV
Lead Channel Setting Pacing Amplitude: 2 V
Lead Channel Setting Pacing Amplitude: 2.5 V
Lead Channel Setting Pacing Pulse Width: 0.4 ms
Lead Channel Setting Sensing Sensitivity: 0.9 mV

## 2020-04-17 NOTE — Progress Notes (Signed)
Remote pacemaker transmission.   

## 2020-06-03 ENCOUNTER — Encounter: Payer: Self-pay | Admitting: Internal Medicine

## 2020-06-03 ENCOUNTER — Ambulatory Visit: Payer: Medicare Other | Admitting: Internal Medicine

## 2020-06-03 ENCOUNTER — Other Ambulatory Visit: Payer: Self-pay

## 2020-06-03 VITALS — BP 136/76 | HR 80 | Ht 71.0 in | Wt 165.4 lb

## 2020-06-03 DIAGNOSIS — I4819 Other persistent atrial fibrillation: Secondary | ICD-10-CM

## 2020-06-03 DIAGNOSIS — D6869 Other thrombophilia: Secondary | ICD-10-CM | POA: Diagnosis not present

## 2020-06-03 DIAGNOSIS — I495 Sick sinus syndrome: Secondary | ICD-10-CM | POA: Diagnosis not present

## 2020-06-03 NOTE — Patient Instructions (Signed)
Medication Instructions:  Your physician recommends that you continue on your current medications as directed. Please refer to the Current Medication list given to you today.  *If you need a refill on your cardiac medications before your next appointment, please call your pharmacy*  Lab Work: None ordered.  If you have labs (blood work) drawn today and your tests are completely normal, you will receive your results only by: Marland Kitchen MyChart Message (if you have MyChart) OR . A paper copy in the mail If you have any lab test that is abnormal or we need to change your treatment, we will call you to review the results.  Testing/Procedures: None ordered.  Follow-Up: At [facility], you and your health needs are our priority.  As part of our continuing mission to provide you with exceptional heart care, we have created designated Provider Care Teams.  These Care Teams include your primary Cardiologist (physician) and Advanced Practice Providers (APPs -  Physician Assistants and Nurse Practitioners) who all work together to provide you with the care you need, when you need it.  We recommend signing up for the patient portal called "MyChart".  Sign up information is provided on this After Visit Summary.  MyChart is used to connect with patients for Virtual Visits (Telemedicine).  Patients are able to view lab/test results, encounter notes, upcoming appointments, etc.  Non-urgent messages can be sent to your provider as well.   To learn more about what you can do with MyChart, go to [email or web address].    Your next appointment:   Your physician wants you to follow-up in: 1 year follow up with Renee. You will receive a reminder letter in the mail two months in advance. If you don't receive a letter, please call our office to schedule the follow-up appointment.  Remote monitoring is used to monitor your Pacemaker from home. This monitoring reduces the number of office visits required to check your  device to one time per year. It allows [place] to keep an eye on the functioning of your device to ensure it is working properly. You are scheduled for a device check from home on 07/15/20. You may send your transmission at any time that day. If you have a wireless device, the transmission will be sent automatically. After your physician reviews your transmission, you will receive a postcard with your next transmission date.  Other Instructions:

## 2020-06-03 NOTE — Progress Notes (Signed)
    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  Since last being seen in our clinic, the patient reports doing reasonably well.  He is active for his age.  Today, he denies symptoms of palpitations, chest pain, shortness of breath,  lower extremity edema, dizziness, presyncope, or syncope.  The patient is otherwise without complaint today.   Past Medical History:  Diagnosis Date  . Macular degeneration   . Osteoporosis   . Sick sinus syndrome [facility])    a. s/p MDT dual chamber PPM implant 11/2014   Past Surgical History:  Procedure Laterality Date  . PACEMAKER INSERTION  11/2014   MDT dual chamber PPM implanted for SSS     ROS- all systems are reviewed and negative except as per HPI above  Current Outpatient Medications  Medication Sig Dispense Refill  . apixaban (ELIQUIS) 5 MG TABS tablet Take 1 tablet (5 mg total) by mouth 2 (two) times daily. 180 tablet 3  . Ascorbic Acid (VITAMIN C) 1000 MG tablet Take 1,000 mg by mouth daily.    . cholecalciferol (VITAMIN D) 1000 UNITS tablet Take 1,000 Units by mouth daily.    . cyanocobalamin 1000 MCG tablet Take 1,000 mcg by mouth daily.     . traMADol (ULTRAM) 50 MG tablet TAKE 1 TABLET BY MOUTH UP TO EVERY 8 HOURS AS NEEDED FOR PAIN    . zolpidem (AMBIEN) 10 MG tablet Take 5 mg by mouth at bedtime as needed for sleep.   0   No current facility-administered medications for this visit.    Physical Exam: Vitals:   06/03/20 1352  BP: 136/76  Pulse: 80  SpO2: 96%  Weight: 165 lb 6.4 oz (75 kg)  Height: 5\' 11"  (1.803 m)    GEN- The patient is well appearing, alert and oriented x 3 today.   Head- normocephalic, atraumatic Eyes-  Sclera clear, conjunctiva pink Ears- hearing intact Oropharynx- clear Lungs- Clear to ausculation bilaterally, normal work of breathing Chest- pacemaker pocket is well healed Heart- Regular rate and rhythm,  (paced) GI- soft, NT, ND, + BS Extremities- no clubbing, cyanosis, or edema  Pacemaker interrogation- reviewed in detail today,  See PACEART report  ekg tracing ordered today is personally reviewed and shows afib, V paced  Assessment and Plan:  1. Symptomatic complete heart block Normal pacemaker function See Pace Art report Reprogrammed VVIR today he is device dependant today  2. Longstanding persistent atrial fibrillation Continue eliquis  Risks, benefits and potential toxicities for medications prescribed and/or refilled reviewed with patient today.   Return to see EP PA in a year  MD, [facility] 06/03/2020 2:10 PM

## 2020-06-25 ENCOUNTER — Other Ambulatory Visit: Payer: Self-pay | Admitting: Internal Medicine

## 2020-06-25 NOTE — Telephone Encounter (Signed)
Prescription refill request for Eliquis received.  Indication: afib Last office visit: 06/03/2020, Allred Scr: 1.32, 07/24/2019 Age: 87 yo  Weight: 75 kg   Prescription refill sent.

## 2020-06-28 ENCOUNTER — Other Ambulatory Visit: Payer: Self-pay | Admitting: Internal Medicine

## 2020-06-28 DIAGNOSIS — Z13820 Encounter for screening for osteoporosis: Secondary | ICD-10-CM

## 2020-07-15 ENCOUNTER — Ambulatory Visit (INDEPENDENT_AMBULATORY_CARE_PROVIDER_SITE_OTHER): Payer: Medicare Other

## 2020-07-15 DIAGNOSIS — I495 Sick sinus syndrome: Secondary | ICD-10-CM | POA: Diagnosis not present

## 2020-07-17 LAB — CUP PACEART REMOTE DEVICE CHECK
Battery Remaining Longevity: 28 mo
Battery Voltage: 2.96 V
Brady Statistic RA Percent Paced: 0 %
Brady Statistic RV Percent Paced: 99.6 %
Date Time Interrogation Session: 20220214141239
Implantable Lead Implant Date: 20160724
Implantable Lead Implant Date: 20160724
Implantable Lead Location: 753859
Implantable Lead Location: 753860
Implantable Lead Model: 5076
Implantable Lead Model: 5076
Implantable Pulse Generator Implant Date: 20160724
Lead Channel Impedance Value: 266 Ohm
Lead Channel Impedance Value: 323 Ohm
Lead Channel Impedance Value: 323 Ohm
Lead Channel Impedance Value: 361 Ohm
Lead Channel Pacing Threshold Amplitude: 0.625 V
Lead Channel Pacing Threshold Amplitude: 0.625 V
Lead Channel Pacing Threshold Pulse Width: 0.4 ms
Lead Channel Pacing Threshold Pulse Width: 0.4 ms
Lead Channel Sensing Intrinsic Amplitude: 0.375 mV
Lead Channel Sensing Intrinsic Amplitude: 0.375 mV
Lead Channel Sensing Intrinsic Amplitude: 7.25 mV
Lead Channel Sensing Intrinsic Amplitude: 7.25 mV
Lead Channel Setting Pacing Amplitude: 2.5 V
Lead Channel Setting Pacing Pulse Width: 0.4 ms
Lead Channel Setting Sensing Sensitivity: 0.9 mV

## 2020-07-22 NOTE — Progress Notes (Signed)
Remote pacemaker transmission.   

## 2020-10-14 ENCOUNTER — Ambulatory Visit (INDEPENDENT_AMBULATORY_CARE_PROVIDER_SITE_OTHER): Payer: Medicare Other

## 2020-10-14 DIAGNOSIS — I495 Sick sinus syndrome: Secondary | ICD-10-CM | POA: Diagnosis not present

## 2020-10-15 LAB — CUP PACEART REMOTE DEVICE CHECK
Battery Remaining Longevity: 25 mo
Battery Voltage: 2.95 V
Brady Statistic RA Percent Paced: 0 %
Brady Statistic RV Percent Paced: 99.28 %
Date Time Interrogation Session: 20220517111000
Implantable Lead Implant Date: 20160724
Implantable Lead Implant Date: 20160724
Implantable Lead Location: 753859
Implantable Lead Location: 753860
Implantable Lead Model: 5076
Implantable Lead Model: 5076
Implantable Pulse Generator Implant Date: 20160724
Lead Channel Impedance Value: 266 Ohm
Lead Channel Impedance Value: 304 Ohm
Lead Channel Impedance Value: 304 Ohm
Lead Channel Impedance Value: 361 Ohm
Lead Channel Pacing Threshold Amplitude: 0.625 V
Lead Channel Pacing Threshold Amplitude: 0.625 V
Lead Channel Pacing Threshold Pulse Width: 0.4 ms
Lead Channel Pacing Threshold Pulse Width: 0.4 ms
Lead Channel Sensing Intrinsic Amplitude: 0.375 mV
Lead Channel Sensing Intrinsic Amplitude: 0.375 mV
Lead Channel Sensing Intrinsic Amplitude: 8.125 mV
Lead Channel Sensing Intrinsic Amplitude: 8.125 mV
Lead Channel Setting Pacing Amplitude: 2.5 V
Lead Channel Setting Pacing Pulse Width: 0.4 ms
Lead Channel Setting Sensing Sensitivity: 0.9 mV

## 2020-11-06 NOTE — Progress Notes (Signed)
Remote pacemaker transmission.   

## 2020-12-21 ENCOUNTER — Other Ambulatory Visit: Payer: Self-pay | Admitting: Internal Medicine

## 2020-12-23 NOTE — Telephone Encounter (Addendum)
Pt last saw Dr Rayann Heman 06/03/20, last labs 07/24/19 Creat 1.32, pt is overdue for labwork.  Age 87, weight 75kg, based on specified criteria and previous labwork pt is on appropriate dosage of Eliquis '5mg'$  BID.  Will call pt to get lab appointment.

## 2020-12-23 NOTE — Telephone Encounter (Signed)
Called Dr Perini's office requested most recent labwork, had to leave VM for medical records.  Will await labwork.

## 2020-12-23 NOTE — Telephone Encounter (Signed)
Received labwork from pt's PCP, Hgb 12.9 and Creat 1.3 on 03/22/20.  Based on specified criteria pt is on appropriate dosage of Eliquis '5mg'$  BID.  Will refill rx.

## 2021-01-13 ENCOUNTER — Ambulatory Visit (INDEPENDENT_AMBULATORY_CARE_PROVIDER_SITE_OTHER): Payer: Medicare Other

## 2021-01-13 DIAGNOSIS — I495 Sick sinus syndrome: Secondary | ICD-10-CM

## 2021-01-20 LAB — CUP PACEART REMOTE DEVICE CHECK
Battery Remaining Longevity: 24 mo
Battery Voltage: 2.95 V
Brady Statistic RA Percent Paced: 0 %
Brady Statistic RV Percent Paced: 99.49 %
Date Time Interrogation Session: 20220822104138
Implantable Lead Implant Date: 20160724
Implantable Lead Implant Date: 20160724
Implantable Lead Location: 753859
Implantable Lead Location: 753860
Implantable Lead Model: 5076
Implantable Lead Model: 5076
Implantable Pulse Generator Implant Date: 20160724
Lead Channel Impedance Value: 266 Ohm
Lead Channel Impedance Value: 304 Ohm
Lead Channel Impedance Value: 304 Ohm
Lead Channel Impedance Value: 361 Ohm
Lead Channel Pacing Threshold Amplitude: 0.5 V
Lead Channel Pacing Threshold Amplitude: 0.625 V
Lead Channel Pacing Threshold Pulse Width: 0.4 ms
Lead Channel Pacing Threshold Pulse Width: 0.4 ms
Lead Channel Sensing Intrinsic Amplitude: 0.5 mV
Lead Channel Sensing Intrinsic Amplitude: 0.5 mV
Lead Channel Sensing Intrinsic Amplitude: 8.5 mV
Lead Channel Sensing Intrinsic Amplitude: 8.5 mV
Lead Channel Setting Pacing Amplitude: 2.5 V
Lead Channel Setting Pacing Pulse Width: 0.4 ms
Lead Channel Setting Sensing Sensitivity: 0.9 mV

## 2021-01-31 NOTE — Progress Notes (Signed)
Remote pacemaker transmission.   

## 2021-02-06 ENCOUNTER — Telehealth: Payer: Self-pay | Admitting: Internal Medicine

## 2021-02-06 NOTE — Telephone Encounter (Signed)
Pt c/o Shortness Of Breath: STAT if SOB developed within the last 24 hours or pt is noticeably SOB on the phone  1. Are you currently SOB (can you hear that pt is SOB on the phone)? SOB only when he walks, this have been going on for a couple of years  2. How long have you been experiencing SOB? A couple of years  3. Are you SOB when sitting or when up moving around? Moving around  4. Are you currently experiencing any other symptoms? Back/hip pain which is reoccuring for a long time

## 2021-02-06 NOTE — Telephone Encounter (Signed)
Spoke with the patient's daughter who states that the patient has been having some increased SOB recently. She states that he has had SOB for a while but seems to have progressed some. SOB is only with exertion. He denies any chest pain, dizziness, lightheadedness, or palpitations. States that sometimes his feet swell but that has also been ongoing. He is able to elevate his feet and swelling comes down. Patient had normal remote pacemaker check on 8/22. Advised daughter to continue monitoring his symptoms and let us know if SOB progresses or develops at rest.

## 2021-02-10 NOTE — Telephone Encounter (Signed)
Pts daughter reaching out stating that she has not heard back from Dr. Rayann Heman... please advise.

## 2021-02-10 NOTE — Telephone Encounter (Signed)
Per MD Allred to come in for see APP. Made appointment on 02/14/21.  Daughter in agreement and verbalized understanding.

## 2021-02-13 NOTE — Progress Notes (Deleted)
Electrophysiology Office Note Date: 02/13/2021  ID:  [name], DOB [birth year], MRN [id number]  PCP: Crist Infante, MD Primary Cardiologist: None Electrophysiologist: Thompson Grayer, MD   CC: Pacemaker follow-up  [name] is a 87 y.o. male seen today for Thompson Grayer, MD for acute visit due to fatigue and SOB.  Since last being seen in our clinic the patient reports doing ***.  he denies chest pain, palpitations, dyspnea, PND, orthopnea, nausea, vomiting, dizziness, syncope, edema, weight gain, or early satiety.  Device History: MDT dual chamber PPM implanted 2016 for SSS  Past Medical History:  Diagnosis Date   Macular degeneration    Osteoporosis    Sick sinus syndrome ([place])    a. s/p MDT dual chamber PPM implant 11/2014   Past Surgical History:  Procedure Laterality Date   PACEMAKER INSERTION  11/2014   MDT dual chamber PPM implanted for SSS     Current Outpatient Medications  Medication Sig Dispense Refill   Ascorbic Acid (VITAMIN C) 1000 MG tablet Take 1,000 mg by mouth daily.     cholecalciferol (VITAMIN D) 1000 UNITS tablet Take 1,000 Units by mouth daily.     cyanocobalamin 1000 MCG tablet Take 1,000 mcg by mouth daily.      ELIQUIS 5 MG TABS tablet TAKE 1 TABLET(5 MG) BY MOUTH TWICE DAILY 180 tablet 1   traMADol (ULTRAM) 50 MG tablet TAKE 1 TABLET BY MOUTH UP TO EVERY 8 HOURS AS NEEDED FOR PAIN     zolpidem (AMBIEN) 10 MG tablet Take 5 mg by mouth at bedtime as needed for sleep.   0   No current facility-administered medications for this visit.    Allergies:   Patient has no known allergies.   Social History: Social History   Socioeconomic History   Marital status: Widowed    Spouse name: Not on file   Number of children: Not on file   Years of education: Not on file   Highest education level: Not on file  Occupational History   Not on file  Tobacco Use   Smoking status: Never   Smokeless tobacco: Never  Substance and Sexual Activity    Alcohol use: Not on file   Drug use: Not on file   Sexual activity: Not on file  Other Topics Concern   Not on file  Social History Narrative   Not on file   Social Determinants of Health   Financial Resource Strain: Not on file  Food Insecurity: Not on file  Transportation Needs: Not on file  Physical Activity: Not on file  Stress: Not on file  Social Connections: Not on file  Intimate Partner Violence: Not on file    Family History: Family History  Problem Relation Age of Onset   Other Mother        OLD AGE   Stroke Father    Heart attack Brother    Cancer Brother    Cancer Brother    Healthy Son    Healthy Daughter      Review of Systems: All other systems reviewed and are otherwise negative except as noted above.  Physical Exam: There were no vitals filed for this visit.   GEN- The patient is well appearing, alert and oriented x 3 today.   HEENT: normocephalic, atraumatic; sclera clear, conjunctiva pink; hearing intact; oropharynx clear; neck supple  Lungs- Clear to ausculation bilaterally, normal work of breathing.  No wheezes, rales, rhonchi Heart- Regular rate and rhythm, no murmurs,  rubs or gallops  GI- soft, non-tender, non-distended, bowel sounds present  Extremities- no clubbing or cyanosis. No edema MS- no significant deformity or atrophy Skin- warm and dry, no rash or lesion; PPM pocket well healed Psych- euthymic mood, full affect Neuro- strength and sensation are intact  PPM Interrogation- reviewed in detail today,  See PACEART report  EKG:  EKG is ordered today. Personal review of ekg ordered today shows ***   Recent Labs: No results found for requested labs within last 8760 hours.   Wt Readings from Last 3 Encounters:  06/03/20 165 lb 6.4 oz (75 kg)  07/24/19 171 lb 9.6 oz (77.8 kg)  06/07/19 170 lb (77.1 kg)     Other studies Reviewed: Additional studies/ records that were reviewed today include: Previous EP office notes, Previous  remote checks, Most recent labwork.   Assessment and Plan:  1. Symptomatic bradycardia s/p Medtronic PPM  Normal PPM function See Pace Art report No changes today  2. Longstanding persistent atrial fibrillation  3. Fatigue/DOE   Current medicines are reviewed at length with the patient today.   The patient {ACTIONS; HAS/DOES NOT HAVE:19233} concerns regarding his medicines.  The following changes were made today:  {NONE DEFAULTED:18576}  Labs/ tests ordered today include: *** No orders of the defined types were placed in this encounter.    Disposition:   Follow up with {Blank single:19197::"Dr. Allred","Dr. Arlan Organ. Klein","Dr. Camnitz","Dr. Lambert","EP APP"} in *** {Blank single:19197::"Months","Weeks"}    Signed, Shirley Friar, PA-C  02/13/2021 8:21 AM  [facility] [address] [place] [address] [phone] (office) [phone] (fax)

## 2021-02-14 ENCOUNTER — Encounter: Payer: Medicare Other | Admitting: Student

## 2021-02-14 DIAGNOSIS — I495 Sick sinus syndrome: Secondary | ICD-10-CM

## 2021-02-14 DIAGNOSIS — I4819 Other persistent atrial fibrillation: Secondary | ICD-10-CM

## 2021-02-14 DIAGNOSIS — R5383 Other fatigue: Secondary | ICD-10-CM

## 2021-02-18 NOTE — Progress Notes (Signed)
Electrophysiology Office Note Date: 02/19/2021  ID:  [name], [name] 05/25/1925, MRN [id number]  PCP: Crist Infante, MD Primary Cardiologist: None Electrophysiologist: Thompson Grayer, MD   CC: Pacemaker follow-up  [name] is a 87 y.o. male seen today for Thompson Grayer, MD for routine electrophysiology followup.  Since last being seen in our clinic the patient reports doing OK. He is here today to ask about SOB. He says it has been pretty stable over the past few years. His daughter notices he takes a golf cart to the mail box now instead of walking. He thinks he's had a gradual decline since COVID. He has mild SOB with inclines or stairs. He can "mess around" in the yard for about 10-15 minutes prior to needing to sit and rest for a few minutes. He is able to carry on conversations. He has mild peripheral edema in the EVENINGs, but it is gone by morning. No chest pain, syncope, lightheadedness or dizziness. Easy bruising on eliquis but no overt bleeding.   Device History: MDT dual chamber PPM implanted 2016 for SSS  Past Medical History:  Diagnosis Date   Macular degeneration    Osteoporosis    Sick sinus syndrome ([place])    a. s/p MDT dual chamber PPM implant 11/2014   Past Surgical History:  Procedure Laterality Date   PACEMAKER INSERTION  11/2014   MDT dual chamber PPM implanted for SSS     Current Outpatient Medications  Medication Sig Dispense Refill   Ascorbic Acid (VITAMIN C) 1000 MG tablet Take 1,000 mg by mouth daily.     cholecalciferol (VITAMIN D) 1000 UNITS tablet Take 1,000 Units by mouth daily.     cyanocobalamin 1000 MCG tablet Take 1,000 mcg by mouth daily.      ELIQUIS 5 MG TABS tablet TAKE 1 TABLET(5 MG) BY MOUTH TWICE DAILY 180 tablet 1   traMADol (ULTRAM) 50 MG tablet TAKE 1 TABLET BY MOUTH UP TO EVERY 8 HOURS AS NEEDED FOR PAIN     zolpidem (AMBIEN) 10 MG tablet Take 5 mg by mouth at bedtime as needed for sleep.   0   No current  facility-administered medications for this visit.    Allergies:   Patient has no known allergies.   Social History: Social History   Socioeconomic History   Marital status: Widowed    Spouse name: Not on file   Number of children: Not on file   Years of education: Not on file   Highest education level: Not on file  Occupational History   Not on file  Tobacco Use   Smoking status: Never   Smokeless tobacco: Never  Substance and Sexual Activity   Alcohol use: Not on file   Drug use: Not on file   Sexual activity: Not on file  Other Topics Concern   Not on file  Social History Narrative   Not on file   Social Determinants of Health   Financial Resource Strain: Not on file  Food Insecurity: Not on file  Transportation Needs: Not on file  Physical Activity: Not on file  Stress: Not on file  Social Connections: Not on file  Intimate Partner Violence: Not on file    Family History: Family History  Problem Relation Age of Onset   Other Mother        OLD AGE   Stroke Father    Heart attack Brother    Cancer Brother    Cancer Brother  Healthy Son    Healthy Daughter      Review of Systems: All other systems reviewed and are otherwise negative except as noted above.  Physical Exam: Vitals:   02/19/21 0933  BP: 140/78  Pulse: 69  SpO2: 98%  Weight: 162 lb 6.4 oz (73.7 kg)  Height: 5\' 10"  (1.778 m)     GEN- The patient is well appearing, alert and oriented x 3 today.   HEENT: normocephalic, atraumatic; sclera clear, conjunctiva pink; hearing intact; oropharynx clear; neck supple  Lungs- Clear to ausculation bilaterally, normal work of breathing.  No wheezes, rales, rhonchi Heart- Regular rate and rhythm, no murmurs, rubs or gallops  GI- soft, non-tender, non-distended, bowel sounds present  Extremities- no clubbing or cyanosis. No edema MS- no significant deformity or atrophy Skin- warm and dry, no rash or lesion; PPM pocket well healed Psych- euthymic  mood, full affect Neuro- strength and sensation are intact  PPM Interrogation- reviewed in detail today,  See PACEART report  EKG:  EKG is ordered today. Personal review of ekg ordered today shows AF at 69 bpm   Recent Labs: No results found for requested labs within last 8760 hours.   Wt Readings from Last 3 Encounters:  02/19/21 162 lb 6.4 oz (73.7 kg)  06/03/20 165 lb 6.4 oz (75 kg)  07/24/19 171 lb 9.6 oz (77.8 kg)     Other studies Reviewed: Additional studies/ records that were reviewed today include: Previous EP office notes, Previous remote checks, Most recent labwork.   Assessment and Plan:  1. Symptomatic bradycardia s/p Medtronic PPM  Normal PPM function See Pace Art report No changes today Programmed VVIR 06/2020  2. Longstanding persistent atrial fibrillation Rates controlled Continue eliquis for CHA2DS2/VASc of at least 2.   3. Mild DOE Has mild SOB with inclines, stable for years.  Suspect multifactorial and some degree of deconditioning from initial COVID restrictions.  He is overall able to do his ADLs without difficulty, was worried that any of his medications may be causing fatigue.  His exercise has remained stable in the 2-3 range by Device interrogation.   Will make sure he is not anemic.     Current medicines are reviewed at length with the patient today.    Labs/ tests ordered today include:  Orders Placed This Encounter  Procedures   Basic metabolic panel   CBC   EKG 12-Lead   Disposition:   Follow up with EP APP in 12 months, sooner with issues.    Jacalyn Lefevre, PA-C  02/19/2021 9:57 AM  [facility] [address] [place] [address] [phone] (office) [phone] (fax)

## 2021-02-19 ENCOUNTER — Other Ambulatory Visit: Payer: Self-pay

## 2021-02-19 ENCOUNTER — Encounter: Payer: Self-pay | Admitting: Student

## 2021-02-19 ENCOUNTER — Ambulatory Visit: Payer: Medicare Other | Admitting: Student

## 2021-02-19 VITALS — BP 140/78 | HR 69 | Ht 70.0 in | Wt 162.4 lb

## 2021-02-19 DIAGNOSIS — R609 Edema, unspecified: Secondary | ICD-10-CM | POA: Diagnosis not present

## 2021-02-19 DIAGNOSIS — R5383 Other fatigue: Secondary | ICD-10-CM | POA: Diagnosis not present

## 2021-02-19 DIAGNOSIS — I495 Sick sinus syndrome: Secondary | ICD-10-CM | POA: Diagnosis not present

## 2021-02-19 LAB — CUP PACEART INCLINIC DEVICE CHECK
Battery Remaining Longevity: 24 mo
Battery Voltage: 2.94 V
Brady Statistic RA Percent Paced: 0 %
Brady Statistic RV Percent Paced: 99.43 %
Date Time Interrogation Session: 20220921095642
Implantable Lead Implant Date: 20160724
Implantable Lead Implant Date: 20160724
Implantable Lead Location: 753859
Implantable Lead Location: 753860
Implantable Lead Model: 5076
Implantable Lead Model: 5076
Implantable Pulse Generator Implant Date: 20160724
Lead Channel Impedance Value: 285 Ohm
Lead Channel Impedance Value: 323 Ohm
Lead Channel Impedance Value: 323 Ohm
Lead Channel Impedance Value: 361 Ohm
Lead Channel Pacing Threshold Amplitude: 0.5 V
Lead Channel Pacing Threshold Amplitude: 0.625 V
Lead Channel Pacing Threshold Pulse Width: 0.4 ms
Lead Channel Pacing Threshold Pulse Width: 0.4 ms
Lead Channel Sensing Intrinsic Amplitude: 0.5 mV
Lead Channel Sensing Intrinsic Amplitude: 0.75 mV
Lead Channel Sensing Intrinsic Amplitude: 7.125 mV
Lead Channel Sensing Intrinsic Amplitude: 7.125 mV
Lead Channel Setting Pacing Amplitude: 2.5 V
Lead Channel Setting Pacing Pulse Width: 0.4 ms
Lead Channel Setting Sensing Sensitivity: 0.9 mV

## 2021-02-19 LAB — CBC
Hematocrit: 28.1 % — ABNORMAL LOW (ref 37.5–51.0)
Hemoglobin: 9.2 g/dL — ABNORMAL LOW (ref 13.0–17.7)
MCH: 26.9 pg (ref 26.6–33.0)
MCHC: 32.7 g/dL (ref 31.5–35.7)
MCV: 82 fL (ref 79–97)
Platelets: 195 10*3/uL (ref 150–450)
RBC: 3.42 x10E6/uL — ABNORMAL LOW (ref 4.14–5.80)
RDW: 17 % — ABNORMAL HIGH (ref 11.6–15.4)
WBC: 13 10*3/uL — ABNORMAL HIGH (ref 3.4–10.8)

## 2021-02-19 LAB — BASIC METABOLIC PANEL
BUN/Creatinine Ratio: 34 — ABNORMAL HIGH (ref 10–24)
BUN: 50 mg/dL — ABNORMAL HIGH (ref 10–36)
CO2: 21 mmol/L (ref 20–29)
Calcium: 9.4 mg/dL (ref 8.6–10.2)
Chloride: 104 mmol/L (ref 96–106)
Creatinine, Ser: 1.45 mg/dL — ABNORMAL HIGH (ref 0.76–1.27)
Glucose: 87 mg/dL (ref 65–99)
Potassium: 5 mmol/L (ref 3.5–5.2)
Sodium: 138 mmol/L (ref 134–144)
eGFR: 45 mL/min/{1.73_m2} — ABNORMAL LOW (ref >59)

## 2021-02-19 NOTE — Patient Instructions (Signed)
Medication Instructions:  Your physician recommends that you continue on your current medications as directed. Please refer to the Current Medication list given to you today.  *If you need a refill on your cardiac medications before your next appointment, please call your pharmacy*   Lab Work: TODAY: BMET, CBC  If you have labs (blood work) drawn today and your tests are completely normal, you will receive your results only by: [place] (if you have MyChart) OR A paper copy in the mail If you have any lab test that is abnormal or we need to change your treatment, we will call you to review the results.   Follow-Up: At [facility], you and your health needs are our priority.  As part of our continuing mission to provide you with exceptional heart care, we have created designated Provider Care Teams.  These Care Teams include your primary Cardiologist (physician) and Advanced Practice Providers (APPs -  Physician Assistants and Nurse Practitioners) who all work together to provide you with the care you need, when you need it.   Your next appointment:   1 year(s)  The format for your next appointment:   In Person  Provider:   You may see Thompson Grayer, MD or one of the following Advanced Practice Providers on your designated Care Team:   Tommye Standard, [place] Legrand Como "[facility]" [place], [place]

## 2021-02-26 ENCOUNTER — Telehealth: Payer: Self-pay | Admitting: Hematology and Oncology

## 2021-02-26 NOTE — Telephone Encounter (Signed)
Scheduled appt per 9/27 referral. PT's daughter is aware of appt date and time.

## 2021-03-06 ENCOUNTER — Inpatient Hospital Stay: Payer: Medicare Other

## 2021-03-06 ENCOUNTER — Other Ambulatory Visit: Payer: Self-pay

## 2021-03-06 ENCOUNTER — Inpatient Hospital Stay: Payer: Medicare Other | Attending: Hematology and Oncology | Admitting: Hematology and Oncology

## 2021-03-06 VITALS — BP 141/79 | HR 73 | Temp 98.0°F | Resp 16 | Ht 70.0 in | Wt 158.1 lb

## 2021-03-06 DIAGNOSIS — D472 Monoclonal gammopathy: Secondary | ICD-10-CM

## 2021-03-06 DIAGNOSIS — D649 Anemia, unspecified: Secondary | ICD-10-CM | POA: Diagnosis not present

## 2021-03-06 DIAGNOSIS — D500 Iron deficiency anemia secondary to blood loss (chronic): Secondary | ICD-10-CM

## 2021-03-06 LAB — CBC WITH DIFFERENTIAL (CANCER CENTER ONLY)
Abs Immature Granulocytes: 0.04 10*3/uL (ref 0.00–0.07)
Basophils Absolute: 0 10*3/uL (ref 0.0–0.1)
Basophils Relative: 0 %
Eosinophils Absolute: 0.1 10*3/uL (ref 0.0–0.5)
Eosinophils Relative: 1 %
HCT: 32.3 % — ABNORMAL LOW (ref 39.0–52.0)
Hemoglobin: 10.2 g/dL — ABNORMAL LOW (ref 13.0–17.0)
Immature Granulocytes: 0 %
Lymphocytes Relative: 27 %
Lymphs Abs: 3.1 10*3/uL (ref 0.7–4.0)
MCH: 27.1 pg (ref 26.0–34.0)
MCHC: 31.6 g/dL (ref 30.0–36.0)
MCV: 85.9 fL (ref 80.0–100.0)
Monocytes Absolute: 0.9 10*3/uL (ref 0.1–1.0)
Monocytes Relative: 7 %
Neutro Abs: 7.7 10*3/uL (ref 1.7–7.7)
Neutrophils Relative %: 65 %
Platelet Count: 182 10*3/uL (ref 150–400)
RBC: 3.76 MIL/uL — ABNORMAL LOW (ref 4.22–5.81)
RDW: 21.5 % — ABNORMAL HIGH (ref 11.5–15.5)
WBC Count: 11.9 10*3/uL — ABNORMAL HIGH (ref 4.0–10.5)
nRBC: 0 % (ref 0.0–0.2)

## 2021-03-06 LAB — CMP (CANCER CENTER ONLY)
ALT: 18 U/L (ref 0–44)
AST: 21 U/L (ref 15–41)
Albumin: 3.5 g/dL (ref 3.5–5.0)
Alkaline Phosphatase: 47 U/L (ref 38–126)
Anion gap: 7 (ref 5–15)
BUN: 38 mg/dL — ABNORMAL HIGH (ref 8–23)
CO2: 24 mmol/L (ref 22–32)
Calcium: 9 mg/dL (ref 8.9–10.3)
Chloride: 107 mmol/L (ref 98–111)
Creatinine: 1.34 mg/dL — ABNORMAL HIGH (ref 0.61–1.24)
GFR, Estimated: 49 mL/min — ABNORMAL LOW (ref >60)
Glucose, Bld: 88 mg/dL (ref 70–99)
Potassium: 4.4 mmol/L (ref 3.5–5.1)
Sodium: 138 mmol/L (ref 135–145)
Total Bilirubin: 0.7 mg/dL (ref 0.3–1.2)
Total Protein: 7.3 g/dL (ref 6.5–8.1)

## 2021-03-06 LAB — RETIC PANEL
Immature Retic Fract: 21.2 % — ABNORMAL HIGH (ref 2.3–15.9)
RBC.: 3.69 MIL/uL — ABNORMAL LOW (ref 4.22–5.81)
Retic Count, Absolute: 58.7 10*3/uL (ref 19.0–186.0)
Retic Ct Pct: 1.6 % (ref 0.4–3.1)
Reticulocyte Hemoglobin: 37.5 pg (ref >27.9)

## 2021-03-06 LAB — FOLATE: Folate: 20.6 ng/mL (ref >5.9)

## 2021-03-06 LAB — VITAMIN B12: Vitamin B-12: 754 pg/mL (ref 180–914)

## 2021-03-06 NOTE — Progress Notes (Signed)
[name] [phone]   [phone]  PROGRESS NOTE  Patient Care Team: Maurice Infante, MD as PCP - General (Internal Medicine) Maurice Grayer, MD as PCP - Electrophysiology (Cardiology)  Hematological/Oncological History #IgG Kappa Monoclonal Gammopathy of Undetermined Significance  03/10/2019: M prrotein 1.2, Gamma globulin 3.5, IgG 2132 (elevated).  Cr 1.4, Ca 8.3, Hgb 11.5 04/24/2019: establish care with Dr. Lorenso Powell. M protein 1.1, Kappa 40.2, Lambda 27.4, ratio 1.47. Ca 8.7, Cr 1.22, Hgb 12.6 07/24/2019: WBC 12.0, Hgb 12.7, MCV 96, Plt 191 02/19/2021: WBC 13.0, Hgb 9.2, MCV 82, Plt 195  Interval History:  [name] 87 y.o. male with medical history significant for MGUS presents for a follow up visit. The patient's last visit was on 07/24/2019. In the interim since the last visit [name] was lost to follow up and has developed worsening anemia.  On exam today [name] notes he was found to be anemic while evaluating for worsening shortness of breath.  He notes that he was told he had hemoglobin 9.2 and began taking iron pills.  He underwent a rectal exam which did not clearly show if it was positive for Hemoccult positive stool.  He notes he does have a physical set up for next month with his PCP.  He has not had any trouble with iron pills.  He notes his energy has been so-so but he feels like his voice is somewhat weaker.  He reports that he is always been strong and works well around the house.  On further discussion he notes that his energy level is good and that his appetite is stable.  He reports that he is able to work in the yard much as he desired.  He reports that he has completed both shots of his Covid vaccine regimen.  Overall he has no additional issues or concerns today.  A full 10 point ROS is listed below.  MEDICAL HISTORY:  Past Medical History:  Diagnosis Date   Macular degeneration    Osteoporosis    Sick sinus syndrome  ([place])    a. s/p MDT dual chamber PPM implant 11/2014    SURGICAL HISTORY: Past Surgical History:  Procedure Laterality Date   PACEMAKER INSERTION  11/2014   MDT dual chamber PPM implanted for SSS     SOCIAL HISTORY: Social History   Socioeconomic History   Marital status: Widowed    Spouse name: Not on file   Number of children: Not on file   Years of education: Not on file   Highest education level: Not on file  Occupational History   Not on file  Tobacco Use   Smoking status: Never   Smokeless tobacco: Never  Substance and Sexual Activity   Alcohol use: Not on file   Drug use: Not on file   Sexual activity: Not on file  Other Topics Concern   Not on file  Social History Narrative   Not on file   Social Determinants of Health   Financial Resource Strain: Not on file  Food Insecurity: Not on file  Transportation Needs: Not on file  Physical Activity: Not on file  Stress: Not on file  Social Connections: Not on file  Intimate Partner Violence: Not on file    FAMILY HISTORY: Family History  Problem Relation Age of Onset   Other Mother        OLD AGE   Stroke Father    Heart attack Brother    Cancer Brother  Cancer Brother    Healthy Son    Healthy Daughter     ALLERGIES:  has No Known Allergies.  MEDICATIONS:  Current Outpatient Medications  Medication Sig Dispense Refill   ferrous sulfate 325 (65 FE) MG tablet Take 325 mg by mouth daily with breakfast.     Ascorbic Acid (VITAMIN C) 1000 MG tablet Take 1,000 mg by mouth daily.     cholecalciferol (VITAMIN D) 1000 UNITS tablet Take 1,000 Units by mouth daily.     cyanocobalamin 1000 MCG tablet Take 1,000 mcg by mouth daily.      ELIQUIS 5 MG TABS tablet TAKE 1 TABLET(5 MG) BY MOUTH TWICE DAILY 180 tablet 1   traMADol (ULTRAM) 50 MG tablet TAKE 1 TABLET BY MOUTH UP TO EVERY 8 HOURS AS NEEDED FOR PAIN     zolpidem (AMBIEN) 10 MG tablet Take 5 mg by mouth at bedtime as needed for sleep.   0   No  current facility-administered medications for this visit.    REVIEW OF SYSTEMS:   Constitutional: ( - ) fevers, ( - )  chills , ( - ) night sweats Eyes: ( - ) blurriness of vision, ( - ) double vision, ( - ) watery eyes Ears, nose, mouth, throat, and face: ( - ) mucositis, ( - ) sore throat Respiratory: ( - ) cough, ( - ) dyspnea, ( - ) wheezes Cardiovascular: ( - ) palpitation, ( - ) chest discomfort, ( - ) lower extremity swelling Gastrointestinal:  ( - ) nausea, ( - ) heartburn, ( - ) change in bowel habits Skin: ( - ) abnormal skin rashes Lymphatics: ( - ) new lymphadenopathy, ( - ) easy bruising Neurological: ( - ) numbness, ( - ) tingling, ( - ) new weaknesses Behavioral/Psych: ( - ) mood change, ( - ) new changes  All other systems were reviewed with the patient and are negative.  PHYSICAL EXAMINATION: ECOG PERFORMANCE STATUS: 1 - Symptomatic but completely ambulatory  Vitals:   03/06/21 1403  BP: (!) 141/79  Pulse: 73  Resp: 16  Temp: 98 F (36.7 C)  SpO2: 98%    Filed Weights   03/06/21 1403  Weight: 158 lb 1.6 oz (71.7 kg)    GENERAL: Well-appearing elderly Caucasian male, alert, no distress and comfortable SKIN: erythematous rash with crusting around the nose. Extends from ear to ear and to the hairline. Spares the forehead and neck.  EYES: conjunctiva are pink and non-injected, sclera clear LUNGS: clear to auscultation and percussion with normal breathing effort HEART: regular rate & rhythm and no murmurs and no lower extremity edema Musculoskeletal: no cyanosis of digits and no clubbing  PSYCH: alert & oriented x 3, fluent speech NEURO: no focal motor/sensory deficits  LABORATORY DATA:  I have reviewed the data as listed CBC Latest Ref Rng & Units 03/06/2021 02/19/2021 07/24/2019  WBC 4.0 - 10.5 K/uL 11.9(H) 13.0(H) 12.0(H)  Hemoglobin 13.0 - 17.0 g/dL 10.2(L) 9.2(L) 12.7(L)  Hematocrit 39.0 - 52.0 % 32.3(L) 28.1(L) 38.8(L)  Platelets 150 - 400 K/uL 182 195  191    CMP Latest Ref Rng & Units 03/06/2021 02/19/2021 07/24/2019  Glucose 70 - 99 mg/dL 88 87 96  BUN 8 - 23 mg/dL 38(H) 50(H) 20  Creatinine 0.61 - 1.24 mg/dL 1.34(H) 1.45(H) 1.32(H)  Sodium 135 - 145 mmol/L 138 138 141  Potassium 3.5 - 5.1 mmol/L 4.4 5.0 4.4  Chloride 98 - 111 mmol/L 107 104 106  CO2 22 - 32  mmol/L '24 21 26  ' Calcium 8.9 - 10.3 mg/dL 9.0 9.4 9.0  Total Protein 6.5 - 8.1 g/dL 7.3 - 8.1  Total Bilirubin 0.3 - 1.2 mg/dL 0.7 - 0.8  Alkaline Phos 38 - 126 U/L 47 - 62  AST 15 - 41 U/L 21 - 20  ALT 0 - 44 U/L 18 - 15    RADIOGRAPHIC STUDIES: I have personally reviewed the radiological images as listed and agreed with the findings in the report. CUP PACEART INCLINIC DEVICE CHECK  Result Date: 02/19/2021 Pacemaker check in clinic. Normal device function. Thresholds, sensing, impedances consistent with previous measurements. Device programmed to maximize longevity. Permanent AF, programmed VVIR. Device programmed at appropriate safety margins. Histogram distribution appropriate for patient activity level. Estimated longevity 2 years. Patient enrolled in remote follow-up. Patient education completed.   ASSESSMENT & PLAN CHERRY WITTWER 87 y.o. male with medical history significant for MGUS presents for a follow up visit.   #Worsening normocytic anemia --Hemoglobin last checked on 02/19/2021 was 9.2.  This is a considerable drop from his near normal baseline of high 12 last noted in 2021 --Etiology is unclear, patient recently started on p.o. iron therapy --We will evaluate to assure his MGUS has not worsened and we will order full nutritional panel --Return to clinic pending results of anemia studies  #IgG Kappa Monoclonal Gammopathy of Undetermined Significance --will order repeat SPEP, SFLC, beta-2-microglobulin today --repeat CMP and CBC to be ordered today --will order a bone survey yearly. Last on 05/19/2019.  Currently due. --indication for a bone marrow biopsy based  on dropping Hgb --RTC pending results of bone marrow biopsy.     Orders Placed This Encounter  Procedures   CBC with Differential ([facility] Only)    Standing Status:   Future    Number of Occurrences:   1    Standing Expiration Date:   03/06/2022   CMP ([facility] only)    Standing Status:   Future    Number of Occurrences:   1    Standing Expiration Date:   03/06/2022   Ferritin    Standing Status:   Future    Number of Occurrences:   1    Standing Expiration Date:   03/06/2022   Iron and TIBC    Standing Status:   Future    Number of Occurrences:   1    Standing Expiration Date:   03/06/2022   Retic Panel    Standing Status:   Future    Number of Occurrences:   1    Standing Expiration Date:   03/06/2022   Folate, Serum    Standing Status:   Future    Number of Occurrences:   1    Standing Expiration Date:   03/06/2022   Vitamin B12    Standing Status:   Future    Number of Occurrences:   1    Standing Expiration Date:   03/06/2022   Multiple Myeloma Panel (SPEP&IFE w/QIG)    Standing Status:   Future    Number of Occurrences:   1    Standing Expiration Date:   03/06/2022   Kappa/lambda light chains    Standing Status:   Future    Number of Occurrences:   1    Standing Expiration Date:   03/06/2022   24-Hr Ur UPEP/UIFE/Light Chains/TP    Standing Status:   Future    Number of Occurrences:   1    Standing Expiration Date:  03/06/2022     All questions were answered. The patient knows to call the clinic with any problems, questions or concerns.  A total of more than 30 minutes were spent on this encounter and over half of that time was spent on counseling and coordination of care as outlined above.   Ledell Peoples, MD Department of Hematology/Oncology [place] at [facility] Phone: [phone] Pager: [phone] Email: [email or web address]  03/16/2021 4:49 PM    Literature Support:  1) Kyle RA, Durie BG, Rajkumar SV,  et al. Monoclonal gammopathy of undetermined significance (MGUS) and smoldering (asymptomatic) multiple myeloma: IMWG consensus perspectives risk factors for progression and guidelines for monitoring and management. Leukemia. 2010;24(6):1121-1127. doi:10.1038/leu.2010.60   --If a patient with apparent MGUS has a serum monoclonal protein >15 g/l, IgA or IgM protein type, or an abnormal FLC ratio, a BM aspirate and biopsy should be carried out at baseline to rule out underlying PC malignancy.   2) Marylyn Ishihara RA, Therneau TM, Rajkumar SV, et al. Prevalence of monoclonal gammopathy of undetermined significance. N Engl J Med [id number]   --MGUS was found in 3.2 percent of persons 25 years of age or older and 5.3 percent of persons 65 years of age or older.

## 2021-03-07 LAB — IRON AND TIBC
Iron: 123 ug/dL (ref 42–163)
Saturation Ratios: 40 % (ref 20–55)
TIBC: 309 ug/dL (ref 202–409)
UIBC: 186 ug/dL (ref 117–376)

## 2021-03-07 LAB — KAPPA/LAMBDA LIGHT CHAINS
Kappa free light chain: 36.8 mg/L — ABNORMAL HIGH (ref 3.3–19.4)
Kappa, lambda light chain ratio: 2 — ABNORMAL HIGH (ref 0.26–1.65)
Lambda free light chains: 18.4 mg/L (ref 5.7–26.3)

## 2021-03-07 LAB — FERRITIN: Ferritin: 60 ng/mL (ref 24–336)

## 2021-03-10 ENCOUNTER — Encounter: Payer: Self-pay | Admitting: Hematology and Oncology

## 2021-03-10 LAB — MULTIPLE MYELOMA PANEL, SERUM
Albumin SerPl Elph-Mcnc: 3.4 g/dL (ref 2.9–4.4)
Albumin/Glob SerPl: 1 (ref 0.7–1.7)
Alpha 1: 0.2 g/dL (ref 0.0–0.4)
Alpha2 Glob SerPl Elph-Mcnc: 0.5 g/dL (ref 0.4–1.0)
B-Globulin SerPl Elph-Mcnc: 0.8 g/dL (ref 0.7–1.3)
Gamma Glob SerPl Elph-Mcnc: 2 g/dL — ABNORMAL HIGH (ref 0.4–1.8)
Globulin, Total: 3.6 g/dL (ref 2.2–3.9)
IgA: 272 mg/dL (ref 61–437)
IgG (Immunoglobin G), Serum: 2001 mg/dL — ABNORMAL HIGH (ref 603–1613)
IgM (Immunoglobulin M), Srm: 49 mg/dL (ref 15–143)
M Protein SerPl Elph-Mcnc: 1.3 g/dL — ABNORMAL HIGH
Total Protein ELP: 7 g/dL (ref 6.0–8.5)

## 2021-03-11 ENCOUNTER — Inpatient Hospital Stay: Payer: Medicare Other

## 2021-03-13 ENCOUNTER — Other Ambulatory Visit: Payer: Self-pay | Admitting: *Deleted

## 2021-03-13 DIAGNOSIS — D472 Monoclonal gammopathy: Secondary | ICD-10-CM

## 2021-03-18 ENCOUNTER — Telehealth: Payer: Self-pay | Admitting: Hematology and Oncology

## 2021-03-18 LAB — UPEP/UIFE/LIGHT CHAINS/TP, 24-HR UR
% BETA, Urine: 33.9 %
ALPHA 1 URINE: 7.8 %
Albumin, U: 21.9 %
Alpha 2, Urine: 9.2 %
Free Kappa Lt Chains,Ur: 107.09 mg/L — ABNORMAL HIGH (ref 1.17–86.46)
Free Kappa/Lambda Ratio: 10.59 (ref 1.83–14.26)
Free Lambda Lt Chains,Ur: 10.11 mg/L (ref 0.27–15.21)
GAMMA GLOBULIN URINE: 27.3 %
M-SPIKE %, Urine: 14.1 % — ABNORMAL HIGH
M-Spike, Mg/24 Hr: 7 mg/24 hr — ABNORMAL HIGH
Total Protein, Urine-Ur/day: 52 mg/24 hr (ref 30–150)
Total Protein, Urine: 8.6 mg/dL
Total Volume: 600

## 2021-03-18 NOTE — Telephone Encounter (Signed)
Scheduled per sch msg. Called and left msg  

## 2021-04-02 ENCOUNTER — Telehealth: Payer: Self-pay | Admitting: *Deleted

## 2021-04-02 NOTE — Telephone Encounter (Signed)
TCT pt's daughter, Susan. Spoke with her about moving her dad's appt with Dr. Dorsey to after then pt's bone marrow biopsy. Susan states she and her dad are wondering if he should even have the biopsy done in the first place. She says this because if he does have multiple Myeloma and needs chemo, her dad does not want to do that @ age 87.5. She wants to talk to Dr. Dorsey about what her dad's health would be like if they just didn't do anything, other than monitor his labs periodically. Daughter wishes to keep the appt tomorrow as scheduled and discuss these things with Dr. Dorsey 

## 2021-04-03 ENCOUNTER — Encounter: Payer: Self-pay | Admitting: Hematology and Oncology

## 2021-04-03 ENCOUNTER — Other Ambulatory Visit: Payer: Self-pay

## 2021-04-03 ENCOUNTER — Inpatient Hospital Stay: Payer: Medicare Other

## 2021-04-03 ENCOUNTER — Inpatient Hospital Stay: Payer: Medicare Other | Attending: Hematology and Oncology | Admitting: Hematology and Oncology

## 2021-04-03 VITALS — BP 136/90 | HR 77 | Temp 97.7°F | Resp 17 | Wt 201.6 lb

## 2021-04-03 DIAGNOSIS — Z809 Family history of malignant neoplasm, unspecified: Secondary | ICD-10-CM | POA: Diagnosis not present

## 2021-04-03 DIAGNOSIS — D472 Monoclonal gammopathy: Secondary | ICD-10-CM | POA: Diagnosis present

## 2021-04-03 DIAGNOSIS — D649 Anemia, unspecified: Secondary | ICD-10-CM | POA: Insufficient documentation

## 2021-04-03 NOTE — Progress Notes (Signed)
[place] [phone]   [phone]  PROGRESS NOTE  Patient Care Team: Crist Infante, MD as PCP - General (Internal Medicine) Thompson Grayer, MD as PCP - Electrophysiology (Cardiology)  Hematological/Oncological History #IgG Kappa Monoclonal Gammopathy of Undetermined Significance  03/10/2019: M prrotein 1.2, Gamma globulin 3.5, IgG 2132 (elevated).  Cr 1.4, Ca 8.3, Hgb 11.5 04/24/2019: establish care with Dr. Lorenso Courier. M protein 1.1, Kappa 40.2, Lambda 27.4, ratio 1.47. Ca 8.7, Cr 1.22, Hgb 12.6 07/24/2019: WBC 12.0, Hgb 12.7, MCV 96, Plt 191 02/19/2021: WBC 13.0, Hgb 9.2, MCV 82, Plt 195  Interval History:  [name] 87 y.o. male with medical history significant for MGUS presents for a follow up visit. The patient's last visit was on 03/06/2021.  He presents with his daughter today due to his desire to discuss the procedure and possible outcomes in greater detail.  On exam today [name] is in similar health compared to his prior visit.  He is not having any issues with fevers, chills, sweats, nausea, vomiting or diarrhea.  His energy levels are stable.  He denies any overt sources of bleeding and is continued on his p.o. iron therapy without difficulty.  Overall he has no additional issues or concerns today.  A full 10 point ROS is listed below.  The bulk of our discussion was focused on the bone marrow biopsy procedure and the possible options for treatment if we did find multiple myeloma.  The details of this conversation are listed below.  MEDICAL HISTORY:  Past Medical History:  Diagnosis Date   Macular degeneration    Osteoporosis    Sick sinus syndrome ([place])    a. s/p MDT dual chamber PPM implant 11/2014    SURGICAL HISTORY: Past Surgical History:  Procedure Laterality Date   PACEMAKER INSERTION  11/2014   MDT dual chamber PPM implanted for SSS     SOCIAL HISTORY: Social History   Socioeconomic History   Marital status: Widowed     Spouse name: Not on file   Number of children: Not on file   Years of education: Not on file   Highest education level: Not on file  Occupational History   Not on file  Tobacco Use   Smoking status: Never   Smokeless tobacco: Never  Substance and Sexual Activity   Alcohol use: Not on file   Drug use: Not on file   Sexual activity: Not on file  Other Topics Concern   Not on file  Social History Narrative   Not on file   Social Determinants of Health   Financial Resource Strain: Not on file  Food Insecurity: Not on file  Transportation Needs: Not on file  Physical Activity: Not on file  Stress: Not on file  Social Connections: Not on file  Intimate Partner Violence: Not on file    FAMILY HISTORY: Family History  Problem Relation Age of Onset   Other Mother        OLD AGE   Stroke Father    Heart attack Brother    Cancer Brother    Cancer Brother    Healthy Son    Healthy Daughter     ALLERGIES:  has No Known Allergies.  MEDICATIONS:  Current Outpatient Medications  Medication Sig Dispense Refill   Ascorbic Acid (VITAMIN C) 1000 MG tablet Take 1,000 mg by mouth daily.     cholecalciferol (VITAMIN D) 1000 UNITS tablet Take 1,000 Units by mouth daily.     cyanocobalamin  1000 MCG tablet Take 1,000 mcg by mouth daily.      ELIQUIS 5 MG TABS tablet TAKE 1 TABLET(5 MG) BY MOUTH TWICE DAILY 180 tablet 1   ferrous sulfate 325 (65 FE) MG tablet Take 325 mg by mouth daily with breakfast.     traMADol (ULTRAM) 50 MG tablet TAKE 1 TABLET BY MOUTH UP TO EVERY 8 HOURS AS NEEDED FOR PAIN     zolpidem (AMBIEN) 10 MG tablet Take 5 mg by mouth at bedtime as needed for sleep.   0   No current facility-administered medications for this visit.    REVIEW OF SYSTEMS:   Constitutional: ( - ) fevers, ( - )  chills , ( - ) night sweats Eyes: ( - ) blurriness of vision, ( - ) double vision, ( - ) watery eyes Ears, nose, mouth, throat, and face: ( - ) mucositis, ( - ) sore  throat Respiratory: ( - ) cough, ( - ) dyspnea, ( - ) wheezes Cardiovascular: ( - ) palpitation, ( - ) chest discomfort, ( - ) lower extremity swelling Gastrointestinal:  ( - ) nausea, ( - ) heartburn, ( - ) change in bowel habits Skin: ( - ) abnormal skin rashes Lymphatics: ( - ) new lymphadenopathy, ( - ) easy bruising Neurological: ( - ) numbness, ( - ) tingling, ( - ) new weaknesses Behavioral/Psych: ( - ) mood change, ( - ) new changes  All other systems were reviewed with the patient and are negative.  PHYSICAL EXAMINATION: ECOG PERFORMANCE STATUS: 1 - Symptomatic but completely ambulatory  Vitals:   04/03/21 0941  BP: 136/90  Pulse: 77  Resp: 17  Temp: 97.7 F (36.5 C)  SpO2: 97%    Filed Weights   04/03/21 0941  Weight: 201 lb 9.6 oz (91.4 kg)    GENERAL: Well-appearing elderly Caucasian male, alert, no distress and comfortable SKIN: erythematous rash with crusting around the nose. Extends from ear to ear and to the hairline. Spares the forehead and neck.  EYES: conjunctiva are pink and non-injected, sclera clear LUNGS: clear to auscultation and percussion with normal breathing effort HEART: regular rate & rhythm and no murmurs and no lower extremity edema Musculoskeletal: no cyanosis of digits and no clubbing  PSYCH: alert & oriented x 3, fluent speech NEURO: no focal motor/sensory deficits  LABORATORY DATA:  I have reviewed the data as listed CBC Latest Ref Rng & Units 03/06/2021 02/19/2021 07/24/2019  WBC 4.0 - 10.5 K/uL 11.9(H) 13.0(H) 12.0(H)  Hemoglobin 13.0 - 17.0 g/dL 10.2(L) 9.2(L) 12.7(L)  Hematocrit 39.0 - 52.0 % 32.3(L) 28.1(L) 38.8(L)  Platelets 150 - 400 K/uL 182 195 191    CMP Latest Ref Rng & Units 03/06/2021 02/19/2021 07/24/2019  Glucose 70 - 99 mg/dL 88 87 96  BUN 8 - 23 mg/dL 38(H) 50(H) 20  Creatinine 0.61 - 1.24 mg/dL 1.34(H) 1.45(H) 1.32(H)  Sodium 135 - 145 mmol/L 138 138 141  Potassium 3.5 - 5.1 mmol/L 4.4 5.0 4.4  Chloride 98 - 111  mmol/L 107 104 106  CO2 22 - 32 mmol/L _0 Calcium 8.9 - 10.3 mg/dL 9.0 9.4 9.0  Total Protein 6.5 - 8.1 g/dL 7.3 - 8.1  Total Bilirubin 0.3 - 1.2 mg/dL 0.7 - 0.8  Alkaline Phos 38 - 126 U/L 47 - 62  AST 15 - 41 U/L 21 - 20  ALT 0 - 44 U/L 18 - 15    RADIOGRAPHIC STUDIES: No results found.  ASSESSMENT & PLAN [name] 87 y.o. male with medical history significant for MGUS presents for a follow up visit.   Mr. Grantham and his daughter return to clinic prior to the bone marrow biopsy as they wanted to discuss the procedure and the possible results prior to the biopsy.  Today we discussed the risks, benefits, and possible outcomes of bone marrow biopsy.  I discussed with him that if we found multiple myeloma I do believe he would be a candidate for modified treatment.  This would likely consist of bortezomib and dexamethasone.  I also noted that there is no clear etiology for his anemia and this may give US guidance as to the cause.  Additionally I noted that he could opt for no treatment or decided to stop treatment at any time.  The patient and his daughter voiced their understanding and were agreeable to keeping their bone marrow biopsy as scheduled for 04/17/2021.  #Worsening normocytic anemia --Hemoglobin last checked on 02/19/2021 was 9.2.  This is a considerable drop from his near normal baseline of high 12 last noted in 2021. Mild rebound to 10/2 on 03/06/2021.  --Etiology is unclear, patient recently started on p.o. iron therapy --no concerning findings on full nutritional panel --Return to clinic pending results of anemia studies  #IgG Kappa Monoclonal Gammopathy of Undetermined Significance --will order repeat SPEP, SFLC, beta-2-microglobulin at each visit --repeat CMP and CBC to be monitored --will order a bone survey yearly. Last on 05/19/2019.  Currently due. --indication for a bone marrow biopsy based on dropping Hgb --RTC pending results of bone marrow biopsy.      No orders of the defined types were placed in this encounter.   All questions were answered. The patient knows to call the clinic with any problems, questions or concerns.  A total of more than 25 minutes were spent on this encounter and over half of that time was spent on counseling and coordination of care as outlined above.   Ledell Peoples, MD Department of Hematology/Oncology [place] [facility] Phone: [phone] Pager: [phone] Email: [email or web address]  04/03/2021 2:04 PM

## 2021-04-11 ENCOUNTER — Telehealth: Payer: Self-pay | Admitting: Hematology and Oncology

## 2021-04-11 NOTE — Telephone Encounter (Signed)
R/s per patient request, called and left msg.

## 2021-04-14 ENCOUNTER — Ambulatory Visit (INDEPENDENT_AMBULATORY_CARE_PROVIDER_SITE_OTHER): Payer: Medicare Other

## 2021-04-14 DIAGNOSIS — I495 Sick sinus syndrome: Secondary | ICD-10-CM

## 2021-04-14 LAB — CUP PACEART REMOTE DEVICE CHECK
Battery Remaining Longevity: 22 mo
Battery Voltage: 2.94 V
Brady Statistic RA Percent Paced: 0 %
Brady Statistic RV Percent Paced: 99.31 %
Date Time Interrogation Session: 20221114113505
Implantable Lead Implant Date: 20160724
Implantable Lead Implant Date: 20160724
Implantable Lead Location: 753859
Implantable Lead Location: 753860
Implantable Lead Model: 5076
Implantable Lead Model: 5076
Implantable Pulse Generator Implant Date: 20160724
Lead Channel Impedance Value: 285 Ohm
Lead Channel Impedance Value: 323 Ohm
Lead Channel Impedance Value: 323 Ohm
Lead Channel Impedance Value: 361 Ohm
Lead Channel Pacing Threshold Amplitude: 0.625 V
Lead Channel Pacing Threshold Amplitude: 0.625 V
Lead Channel Pacing Threshold Pulse Width: 0.4 ms
Lead Channel Pacing Threshold Pulse Width: 0.4 ms
Lead Channel Sensing Intrinsic Amplitude: 0.625 mV
Lead Channel Sensing Intrinsic Amplitude: 0.625 mV
Lead Channel Sensing Intrinsic Amplitude: 4.5 mV
Lead Channel Sensing Intrinsic Amplitude: 4.5 mV
Lead Channel Setting Pacing Amplitude: 2.5 V
Lead Channel Setting Pacing Pulse Width: 0.4 ms
Lead Channel Setting Sensing Sensitivity: 0.9 mV

## 2021-04-14 NOTE — Telephone Encounter (Signed)
Please request a copy of the labs ordered by his PCP. If they would like to defer the bone marrow biopsy we can see them back in 3 months time as requested.

## 2021-04-17 ENCOUNTER — Ambulatory Visit (HOSPITAL_COMMUNITY): Payer: Medicare Other

## 2021-04-22 NOTE — Progress Notes (Signed)
Remote pacemaker transmission.   

## 2021-05-01 ENCOUNTER — Inpatient Hospital Stay: Payer: Medicare Other

## 2021-05-01 ENCOUNTER — Inpatient Hospital Stay: Payer: Medicare Other | Admitting: Hematology and Oncology

## 2021-05-07 ENCOUNTER — Inpatient Hospital Stay: Payer: Medicare Other | Admitting: Hematology and Oncology

## 2021-05-07 ENCOUNTER — Inpatient Hospital Stay: Payer: Medicare Other

## 2021-06-22 ENCOUNTER — Other Ambulatory Visit: Payer: Self-pay | Admitting: Internal Medicine

## 2021-06-22 DIAGNOSIS — I4819 Other persistent atrial fibrillation: Secondary | ICD-10-CM

## 2021-06-23 NOTE — Telephone Encounter (Signed)
Eliquis 5mg  refill request received. Patient is 88 years old, weight-91.4kg, Crea-1.34 on 03/06/2021, Diagnosis-Afib, and last seen by Oda Kilts on 02/19/2021. Dose is appropriate based on dosing criteria. Will send in refill to requested pharmacy.

## 2021-07-14 ENCOUNTER — Ambulatory Visit (INDEPENDENT_AMBULATORY_CARE_PROVIDER_SITE_OTHER): Payer: Medicare Other

## 2021-07-14 DIAGNOSIS — I495 Sick sinus syndrome: Secondary | ICD-10-CM | POA: Diagnosis not present

## 2021-07-15 LAB — CUP PACEART REMOTE DEVICE CHECK
Battery Remaining Longevity: 18 mo
Battery Voltage: 2.92 V
Brady Statistic RA Percent Paced: 0 %
Brady Statistic RV Percent Paced: 93.43 %
Date Time Interrogation Session: 20230213164002
Implantable Lead Implant Date: 20160724
Implantable Lead Implant Date: 20160724
Implantable Lead Location: 753859
Implantable Lead Location: 753860
Implantable Lead Model: 5076
Implantable Lead Model: 5076
Implantable Pulse Generator Implant Date: 20160724
Lead Channel Impedance Value: 285 Ohm
Lead Channel Impedance Value: 323 Ohm
Lead Channel Impedance Value: 342 Ohm
Lead Channel Impedance Value: 380 Ohm
Lead Channel Pacing Threshold Amplitude: 0.625 V
Lead Channel Pacing Threshold Amplitude: 0.625 V
Lead Channel Pacing Threshold Pulse Width: 0.4 ms
Lead Channel Pacing Threshold Pulse Width: 0.4 ms
Lead Channel Sensing Intrinsic Amplitude: 0.5 mV
Lead Channel Sensing Intrinsic Amplitude: 0.5 mV
Lead Channel Sensing Intrinsic Amplitude: 4 mV
Lead Channel Sensing Intrinsic Amplitude: 4 mV
Lead Channel Setting Pacing Amplitude: 2.5 V
Lead Channel Setting Pacing Pulse Width: 0.4 ms
Lead Channel Setting Sensing Sensitivity: 0.9 mV

## 2021-07-17 NOTE — Progress Notes (Signed)
Remote pacemaker transmission.   

## 2021-07-29 ENCOUNTER — Telehealth: Payer: Self-pay | Admitting: Hematology and Oncology

## 2021-07-29 NOTE — Telephone Encounter (Signed)
Attempted to r/s per provider pal, when I called pt they stated just to cancel appointment because they are "doing just fine" and will reschedule if any new problems occur.

## 2021-08-18 ENCOUNTER — Ambulatory Visit: Payer: Medicare Other | Admitting: Hematology and Oncology

## 2021-08-18 ENCOUNTER — Other Ambulatory Visit: Payer: Medicare Other

## 2021-10-13 ENCOUNTER — Ambulatory Visit (INDEPENDENT_AMBULATORY_CARE_PROVIDER_SITE_OTHER): Payer: Medicare Other

## 2021-10-13 DIAGNOSIS — I495 Sick sinus syndrome: Secondary | ICD-10-CM

## 2021-10-14 LAB — CUP PACEART REMOTE DEVICE CHECK
Battery Remaining Longevity: 17 mo
Battery Voltage: 2.92 V
Brady Statistic RA Percent Paced: 0 %
Brady Statistic RV Percent Paced: 92.05 %
Date Time Interrogation Session: 20230515115140
Implantable Lead Implant Date: 20160724
Implantable Lead Implant Date: 20160724
Implantable Lead Location: 753859
Implantable Lead Location: 753860
Implantable Lead Model: 5076
Implantable Lead Model: 5076
Implantable Pulse Generator Implant Date: 20160724
Lead Channel Impedance Value: 285 Ohm
Lead Channel Impedance Value: 323 Ohm
Lead Channel Impedance Value: 342 Ohm
Lead Channel Impedance Value: 380 Ohm
Lead Channel Pacing Threshold Amplitude: 0.625 V
Lead Channel Pacing Threshold Amplitude: 0.625 V
Lead Channel Pacing Threshold Pulse Width: 0.4 ms
Lead Channel Pacing Threshold Pulse Width: 0.4 ms
Lead Channel Sensing Intrinsic Amplitude: 0.625 mV
Lead Channel Sensing Intrinsic Amplitude: 0.625 mV
Lead Channel Sensing Intrinsic Amplitude: 4.25 mV
Lead Channel Sensing Intrinsic Amplitude: 4.25 mV
Lead Channel Setting Pacing Amplitude: 2.5 V
Lead Channel Setting Pacing Pulse Width: 0.4 ms
Lead Channel Setting Sensing Sensitivity: 0.9 mV

## 2021-10-31 NOTE — Progress Notes (Signed)
Remote pacemaker transmission.   

## 2021-12-25 ENCOUNTER — Other Ambulatory Visit: Payer: Self-pay | Admitting: Student

## 2021-12-25 DIAGNOSIS — I4819 Other persistent atrial fibrillation: Secondary | ICD-10-CM

## 2021-12-25 NOTE — Telephone Encounter (Signed)
Prescription refill request for Eliquis received. Indication: Afib  Last office visit: 02/19/21 Chalmers Cater)  Scr: 0.8 (03/31/21)  Age: 88 Weight: 91.4kg  Appropriate dose and refill sent to requested pharmacy

## 2022-01-12 ENCOUNTER — Ambulatory Visit: Payer: Medicare Other

## 2022-01-14 LAB — CUP PACEART REMOTE DEVICE CHECK
Battery Remaining Longevity: 14 mo
Battery Voltage: 2.9 V
Brady Statistic RA Percent Paced: 0 %
Brady Statistic RV Percent Paced: 92.02 %
Date Time Interrogation Session: 20230815113805
Implantable Lead Implant Date: 20160724
Implantable Lead Implant Date: 20160724
Implantable Lead Location: 753859
Implantable Lead Location: 753860
Implantable Lead Model: 5076
Implantable Lead Model: 5076
Implantable Pulse Generator Implant Date: 20160724
Lead Channel Impedance Value: 266 Ohm
Lead Channel Impedance Value: 323 Ohm
Lead Channel Impedance Value: 323 Ohm
Lead Channel Impedance Value: 380 Ohm
Lead Channel Pacing Threshold Amplitude: 0.625 V
Lead Channel Pacing Threshold Amplitude: 0.625 V
Lead Channel Pacing Threshold Pulse Width: 0.4 ms
Lead Channel Pacing Threshold Pulse Width: 0.4 ms
Lead Channel Sensing Intrinsic Amplitude: 0.5 mV
Lead Channel Sensing Intrinsic Amplitude: 0.5 mV
Lead Channel Sensing Intrinsic Amplitude: 4 mV
Lead Channel Sensing Intrinsic Amplitude: 4 mV
Lead Channel Setting Pacing Amplitude: 2.5 V
Lead Channel Setting Pacing Pulse Width: 0.4 ms
Lead Channel Setting Sensing Sensitivity: 0.9 mV

## 2022-02-27 ENCOUNTER — Telehealth: Payer: Self-pay | Admitting: Hematology and Oncology

## 2022-02-27 NOTE — Telephone Encounter (Signed)
Per 9/29 in basket, called to schedule pt, pt said they do not want anymore appts

## 2022-04-06 ENCOUNTER — Encounter: Payer: Self-pay | Admitting: Cardiovascular Disease

## 2022-04-06 ENCOUNTER — Ambulatory Visit: Payer: Medicare Other | Attending: Cardiovascular Disease | Admitting: Cardiovascular Disease

## 2022-04-06 VITALS — BP 110/70 | HR 76 | Ht 70.0 in | Wt 164.4 lb

## 2022-04-06 DIAGNOSIS — Z950 Presence of cardiac pacemaker: Secondary | ICD-10-CM | POA: Diagnosis not present

## 2022-04-06 DIAGNOSIS — I495 Sick sinus syndrome: Secondary | ICD-10-CM | POA: Diagnosis not present

## 2022-04-06 LAB — CUP PACEART INCLINIC DEVICE CHECK
Battery Remaining Longevity: 13 mo
Battery Voltage: 2.9 V
Brady Statistic RA Percent Paced: 0 %
Brady Statistic RV Percent Paced: 92.1 %
Date Time Interrogation Session: 20231106133041
Implantable Lead Connection Status: 753985
Implantable Lead Connection Status: 753985
Implantable Lead Implant Date: 20160724
Implantable Lead Implant Date: 20160724
Implantable Lead Location: 753859
Implantable Lead Location: 753860
Implantable Lead Model: 5076
Implantable Lead Model: 5076
Implantable Pulse Generator Implant Date: 20160724
Lead Channel Impedance Value: 285 Ohm
Lead Channel Impedance Value: 323 Ohm
Lead Channel Impedance Value: 342 Ohm
Lead Channel Impedance Value: 399 Ohm
Lead Channel Pacing Threshold Amplitude: 0.625 V
Lead Channel Pacing Threshold Amplitude: 0.625 V
Lead Channel Pacing Threshold Amplitude: 0.75 V
Lead Channel Pacing Threshold Pulse Width: 0.4 ms
Lead Channel Pacing Threshold Pulse Width: 0.4 ms
Lead Channel Pacing Threshold Pulse Width: 0.4 ms
Lead Channel Sensing Intrinsic Amplitude: 0.5 mV
Lead Channel Sensing Intrinsic Amplitude: 0.625 mV
Lead Channel Sensing Intrinsic Amplitude: 4 mV
Lead Channel Sensing Intrinsic Amplitude: 4.5 mV
Lead Channel Setting Pacing Amplitude: 2.5 V
Lead Channel Setting Pacing Pulse Width: 0.4 ms
Lead Channel Setting Sensing Sensitivity: 0.9 mV
Zone Setting Status: 755011
Zone Setting Status: 755011

## 2022-04-06 LAB — BASIC METABOLIC PANEL
BUN/Creatinine Ratio: 16 (ref 10–24)
BUN: 20 mg/dL (ref 10–36)
CO2: 24 mmol/L (ref 20–29)
Calcium: 9.3 mg/dL (ref 8.6–10.2)
Chloride: 107 mmol/L — ABNORMAL HIGH (ref 96–106)
Creatinine, Ser: 1.23 mg/dL (ref 0.76–1.27)
Glucose: 94 mg/dL (ref 70–99)
Potassium: 4.7 mmol/L (ref 3.5–5.2)
Sodium: 142 mmol/L (ref 134–144)
eGFR: 54 mL/min/{1.73_m2} — ABNORMAL LOW (ref >59)

## 2022-04-06 NOTE — Patient Instructions (Signed)
Medication Instructions:  Your physician recommends that you continue on your current medications as directed. Please refer to the Current Medication list given to you today.  *If you need a refill on your cardiac medications before your next appointment, please call your pharmacy*  Lab Work: TODAY: BMET If you have labs (blood work) drawn today and your tests are completely normal, you will receive your results only by: [place] (if you have MyChart) OR A paper copy in the mail If you have any lab test that is abnormal or we need to change your treatment, we will call you to review the results.  Follow-Up: At [facility], you and your health needs are our priority.  As part of our continuing mission to provide you with exceptional heart care, we have created designated Provider Care Teams.  These Care Teams include your primary Cardiologist (physician) and Advanced Practice Providers (APPs -  Physician Assistants and Nurse Practitioners) who all work together to provide you with the care you need, when you need it.  Your next appointment:   1 year(s)  The format for your next appointment:   In Person  Provider:   You will see one of the following Advanced Practice Providers on your designated Care Team:   Tommye Standard, [place] Legrand Como "Jonni Sanger" Chalmers Cater, [place]  Important Information About Sugar

## 2022-04-06 NOTE — Progress Notes (Signed)
Electrophysiology Office Note Date: 04/06/2022  ID:  [name], DOB Feb 27, 1925, MRN [id number]  PCP: Crist Infante, MD Primary Cardiologist: None Electrophysiologist: Melida Quitter, MD   CC: Pacemaker follow-up  [name] is a 88 y.o. male seen today for Melida Quitter, MD for routine electrophysiology followup.  Since last being seen in our clinic the patient reports doing OK. He is here today to ask about SOB. He says it has been pretty stable over the past few years. His daughter notices he takes a golf cart to the mail box now instead of walking. He thinks he's had a gradual decline since COVID. He has mild SOB with inclines or stairs. He can "mess around" in the yard for about 10-15 minutes prior to needing to sit and rest for a few minutes. He is able to carry on conversations. He has mild peripheral edema in the EVENINGs, but it is gone by morning.   No chest pain, syncope, lightheadedness or dizziness. Easy bruising on eliquis but no overt bleeding. he has no device related complaints -- no new tenderness, drainage, redness.   Device History: MDT dual chamber PPM implanted 2016 for SSS  Past Medical History:  Diagnosis Date   Macular degeneration    Osteoporosis    Sick sinus syndrome ([place])    a. s/p MDT dual chamber PPM implant 11/2014   Past Surgical History:  Procedure Laterality Date   PACEMAKER INSERTION  11/2014   MDT dual chamber PPM implanted for SSS     Current Outpatient Medications  Medication Sig Dispense Refill   Ascorbic Acid (VITAMIN C) 1000 MG tablet Take 1,000 mg by mouth daily.     cholecalciferol (VITAMIN D) 1000 UNITS tablet Take 1,000 Units by mouth daily.     cyanocobalamin 1000 MCG tablet Take 1,000 mcg by mouth daily.      ELIQUIS 5 MG TABS tablet TAKE 1 TABLET(5 MG) BY MOUTH TWICE DAILY 180 tablet 1   ferrous sulfate 325 (65 FE) MG tablet Take 325 mg by mouth daily with breakfast.     traMADol (ULTRAM) 50 MG tablet TAKE 1  TABLET BY MOUTH UP TO EVERY 8 HOURS AS NEEDED FOR PAIN     zolpidem (AMBIEN) 10 MG tablet Take 5 mg by mouth at bedtime as needed for sleep.   0   No current facility-administered medications for this visit.    Allergies:   Patient has no known allergies.   Social History: Social History   Socioeconomic History   Marital status: Widowed    Spouse name: Not on file   Number of children: Not on file   Years of education: Not on file   Highest education level: Not on file  Occupational History   Not on file  Tobacco Use   Smoking status: Never   Smokeless tobacco: Never  Substance and Sexual Activity   Alcohol use: Not on file   Drug use: Not on file   Sexual activity: Not on file  Other Topics Concern   Not on file  Social History Narrative   Not on file   Social Determinants of Health   Financial Resource Strain: Not on file  Food Insecurity: Not on file  Transportation Needs: Not on file  Physical Activity: Not on file  Stress: Not on file  Social Connections: Not on file  Intimate Partner Violence: Not on file    Family History: Family History  Problem Relation Age of Onset  Other Mother        OLD AGE   Stroke Father    Heart attack Brother    Cancer Brother    Cancer Brother    Healthy Son    Healthy Daughter      Review of Systems: All other systems reviewed and are otherwise negative except as noted above.  Physical Exam: Vitals:   04/06/22 1043  BP: 110/70  Pulse: 76  SpO2: 93%  Weight: 164 lb 6.4 oz (74.6 kg)  Height: '5\' 10"'$  (1.778 m)     GEN- The patient is well appearing, alert and oriented x 3 today.   HEENT: normocephalic, atraumatic; sclera clear, conjunctiva pink; hearing intact; oropharynx clear; neck supple  Lungs- Clear to ausculation bilaterally, normal work of breathing.  No wheezes, rales, rhonchi Heart- Regular rate and rhythm, no murmurs, rubs or gallops  GI- soft, non-tender, non-distended, bowel sounds present   Extremities- no clubbing or cyanosis. No edema MS- no significant deformity or atrophy Skin- warm and dry, no rash or lesion; PPM pocket well healed Psych- euthymic mood, full affect Neuro- strength and sensation are intact  PPM Interrogation- reviewed in detail today,  See PACEART report  EKG:  No ECG today.  Recent Labs: No results found for requested labs within last 365 days.   Wt Readings from Last 3 Encounters:  04/06/22 164 lb 6.4 oz (74.6 kg)  04/03/21 201 lb 9.6 oz (91.4 kg)  03/06/21 158 lb 1.6 oz (71.7 kg)     Other studies Reviewed: Additional studies/ records that were reviewed today include: Previous EP office notes, Previous remote checks, Most recent labwork.   Assessment and Plan:  1. Symptomatic bradycardia s/p Medtronic PPM  Normal PPM function See Pace Art report No changes today Programmed VVIR 06/2020  2. Longstanding persistent atrial fibrillation Rates controlled Continue eliquis for CHA2DS2/VASc of at least 2. Will check BMP today   3. Mild DOE Has mild SOB with inclines, stable for years.  He is overall able to do his ADLs without difficulty, was worried that any of his medications may be causing fatigue.  He has a high RV pacing burden, but I would not offer upgrade to CRT in absence of definitive CHF. His exercise has remained stable in the 2-3 range by Device interrogation.   Will make sure he is not anemic.     Current medicines are reviewed at length with the patient today.    Labs/ tests ordered today include:  Orders Placed This Encounter  Procedures   EKG 12-Lead   Disposition:   Follow up with EP APP in 12 months, sooner with issues.    Signed, Melida Quitter, MD  04/06/2022 10:59 AM  [facility] [address] [place] [address] [phone] (office) [phone] (fax)

## 2022-04-13 ENCOUNTER — Ambulatory Visit (INDEPENDENT_AMBULATORY_CARE_PROVIDER_SITE_OTHER): Payer: Medicare Other

## 2022-04-13 DIAGNOSIS — I495 Sick sinus syndrome: Secondary | ICD-10-CM

## 2022-04-14 LAB — CUP PACEART REMOTE DEVICE CHECK
Battery Remaining Longevity: 13 mo
Battery Voltage: 2.89 V
Brady Statistic RA Percent Paced: 0 %
Brady Statistic RV Percent Paced: 92.01 %
Date Time Interrogation Session: 20231114103103
Implantable Lead Connection Status: 753985
Implantable Lead Connection Status: 753985
Implantable Lead Implant Date: 20160724
Implantable Lead Implant Date: 20160724
Implantable Lead Location: 753859
Implantable Lead Location: 753860
Implantable Lead Model: 5076
Implantable Lead Model: 5076
Implantable Pulse Generator Implant Date: 20160724
Lead Channel Impedance Value: 266 Ohm
Lead Channel Impedance Value: 323 Ohm
Lead Channel Impedance Value: 323 Ohm
Lead Channel Impedance Value: 380 Ohm
Lead Channel Pacing Threshold Amplitude: 0.625 V
Lead Channel Pacing Threshold Amplitude: 0.625 V
Lead Channel Pacing Threshold Pulse Width: 0.4 ms
Lead Channel Pacing Threshold Pulse Width: 0.4 ms
Lead Channel Sensing Intrinsic Amplitude: 0.625 mV
Lead Channel Sensing Intrinsic Amplitude: 0.625 mV
Lead Channel Sensing Intrinsic Amplitude: 4 mV
Lead Channel Sensing Intrinsic Amplitude: 4 mV
Lead Channel Setting Pacing Amplitude: 2.5 V
Lead Channel Setting Pacing Pulse Width: 0.4 ms
Lead Channel Setting Sensing Sensitivity: 0.9 mV
Zone Setting Status: 755011
Zone Setting Status: 755011

## 2022-05-26 NOTE — Progress Notes (Signed)
Remote pacemaker transmission.   

## 2022-06-24 ENCOUNTER — Other Ambulatory Visit: Payer: Self-pay | Admitting: Internal Medicine

## 2022-06-24 DIAGNOSIS — I4819 Other persistent atrial fibrillation: Secondary | ICD-10-CM

## 2022-07-13 ENCOUNTER — Ambulatory Visit: Payer: Medicare Other

## 2022-07-13 DIAGNOSIS — I495 Sick sinus syndrome: Secondary | ICD-10-CM | POA: Diagnosis not present

## 2022-07-14 LAB — CUP PACEART REMOTE DEVICE CHECK
Battery Remaining Longevity: 9 mo
Battery Voltage: 2.88 V
Brady Statistic RA Percent Paced: 0 %
Brady Statistic RV Percent Paced: 94.49 %
Date Time Interrogation Session: 20240213115022
Implantable Lead Connection Status: 753985
Implantable Lead Connection Status: 753985
Implantable Lead Implant Date: 20160724
Implantable Lead Implant Date: 20160724
Implantable Lead Location: 753859
Implantable Lead Location: 753860
Implantable Lead Model: 5076
Implantable Lead Model: 5076
Implantable Pulse Generator Implant Date: 20160724
Lead Channel Impedance Value: 266 Ohm
Lead Channel Impedance Value: 304 Ohm
Lead Channel Impedance Value: 323 Ohm
Lead Channel Impedance Value: 361 Ohm
Lead Channel Pacing Threshold Amplitude: 0.625 V
Lead Channel Pacing Threshold Amplitude: 0.625 V
Lead Channel Pacing Threshold Pulse Width: 0.4 ms
Lead Channel Pacing Threshold Pulse Width: 0.4 ms
Lead Channel Sensing Intrinsic Amplitude: 0.625 mV
Lead Channel Sensing Intrinsic Amplitude: 0.625 mV
Lead Channel Sensing Intrinsic Amplitude: 4 mV
Lead Channel Sensing Intrinsic Amplitude: 4 mV
Lead Channel Setting Pacing Amplitude: 2.5 V
Lead Channel Setting Pacing Pulse Width: 0.4 ms
Lead Channel Setting Sensing Sensitivity: 0.9 mV
Zone Setting Status: 755011
Zone Setting Status: 755011

## 2022-08-26 NOTE — Progress Notes (Signed)
Remote pacemaker transmission.   

## 2022-09-08 ENCOUNTER — Telehealth: Payer: Self-pay

## 2022-09-08 NOTE — Telephone Encounter (Signed)
   Pre-operative Risk Assessment    Patient Name: [name]  DOB: 29-Jul-1924 MRN: [id number]     Request for Surgical Clearance    Procedure:   BIOPSY OF FLOOR OF MOUTH  Date of Surgery:  Clearance TBD                                 Surgeon:  DR. REHM Surgeon's Group or Practice Name:  [facility] Phone number:  [phone] Fax number:  [phone]   Type of Clearance Requested:   - Pharmacy:  Hold Apixaban (Eliquis) WHEN TO HOLD    Type of Anesthesia:  Not Indicated   Additional requests/questions:    SignedMichaelle Copas   09/08/2022, 4:16 PM

## 2022-09-09 NOTE — Telephone Encounter (Signed)
Patient with diagnosis of afib on Eliquis for anticoagulation.    Procedure: biopsy of floor of mouth Date of procedure: TBD  CHA2DS2-VASc Score = 2  This indicates a 2.2% annual risk of stroke. The patient's score is based upon: CHF History: 0 HTN History: 0 Diabetes History: 0 Stroke History: 0 Vascular Disease History: 0 Age Score: 2 Gender Score: 0  CrCl 41mL/min Platelet count 178K  Patient does not require pre-op antibiotics for dental procedure.  Per office protocol, patient can hold Eliquis for 2 days prior to procedure.    **This guidance is not considered finalized until pre-operative APP has relayed final recommendations.**

## 2022-09-09 NOTE — Telephone Encounter (Signed)
   Name: [name]  DOB: 01-04-1925  MRN: [id number]   Primary Cardiologist: None  Chart reviewed as part of pre-operative protocol coverage. Tessie Fass is pending biopsy of floor of mouth. Pharmacy recommendations for holding anticoagulation has been requested.   Per pharm D: Patient with diagnosis of afib on Eliquis for anticoagulation.     Procedure: biopsy of floor of mouth Date of procedure: TBD   CHA2DS2-VASc Score = 2  This indicates a 2.2% annual risk of stroke. The patient's score is based upon: CHF History: 0 HTN History: 0 Diabetes History: 0 Stroke History: 0 Vascular Disease History: 0 Age Score: 2 Gender Score: 0   CrCl 30mL/min Platelet count 178K   Patient does not require pre-op antibiotics for dental procedure.   Per office protocol, patient can hold Eliquis for 2 days prior to procedure.  I will route this recommendation to the requesting party via Epic fax function and remove from pre-op pool. Please call with questions.  Carlos Levering, NP 09/09/2022, 11:58 AM

## 2022-09-28 ENCOUNTER — Other Ambulatory Visit: Payer: Self-pay | Admitting: Cardiovascular Disease

## 2022-09-28 DIAGNOSIS — I4819 Other persistent atrial fibrillation: Secondary | ICD-10-CM

## 2022-09-28 NOTE — Telephone Encounter (Signed)
Prescription refill request for Eliquis received. Indication:a fib Last office visit: 04/06/22 Scr: 1.23 04/06/22 epic Age: 89 Weight: 74kg

## 2022-10-12 ENCOUNTER — Ambulatory Visit (INDEPENDENT_AMBULATORY_CARE_PROVIDER_SITE_OTHER): Payer: Medicare Other

## 2022-10-12 ENCOUNTER — Ambulatory Visit: Payer: Medicare Other

## 2022-10-12 DIAGNOSIS — I495 Sick sinus syndrome: Secondary | ICD-10-CM

## 2022-10-12 LAB — CUP PACEART REMOTE DEVICE CHECK
Battery Remaining Longevity: 6 mo
Battery Voltage: 2.86 V
Brady Statistic RA Percent Paced: 0 %
Brady Statistic RV Percent Paced: 96.67 %
Date Time Interrogation Session: 20240513092545
Implantable Lead Connection Status: 753985
Implantable Lead Connection Status: 753985
Implantable Lead Implant Date: 20160724
Implantable Lead Implant Date: 20160724
Implantable Lead Location: 753859
Implantable Lead Location: 753860
Implantable Lead Model: 5076
Implantable Lead Model: 5076
Implantable Pulse Generator Implant Date: 20160724
Lead Channel Impedance Value: 285 Ohm
Lead Channel Impedance Value: 323 Ohm
Lead Channel Impedance Value: 323 Ohm
Lead Channel Impedance Value: 361 Ohm
Lead Channel Pacing Threshold Amplitude: 0.625 V
Lead Channel Pacing Threshold Amplitude: 0.625 V
Lead Channel Pacing Threshold Pulse Width: 0.4 ms
Lead Channel Pacing Threshold Pulse Width: 0.4 ms
Lead Channel Sensing Intrinsic Amplitude: 0.625 mV
Lead Channel Sensing Intrinsic Amplitude: 0.625 mV
Lead Channel Sensing Intrinsic Amplitude: 4.25 mV
Lead Channel Sensing Intrinsic Amplitude: 4.25 mV
Lead Channel Setting Pacing Amplitude: 2.5 V
Lead Channel Setting Pacing Pulse Width: 0.4 ms
Lead Channel Setting Sensing Sensitivity: 0.9 mV
Zone Setting Status: 755011
Zone Setting Status: 755011

## 2022-10-13 LAB — CUP PACEART REMOTE DEVICE CHECK
Battery Remaining Longevity: 6 mo
Battery Voltage: 2.86 V
Brady Statistic RA Percent Paced: 0 %
Brady Statistic RV Percent Paced: 96.67 %
Date Time Interrogation Session: 20240513092545
Implantable Lead Connection Status: 753985
Implantable Lead Connection Status: 753985
Implantable Lead Implant Date: 20160724
Implantable Lead Implant Date: 20160724
Implantable Lead Location: 753859
Implantable Lead Location: 753860
Implantable Lead Model: 5076
Implantable Lead Model: 5076
Implantable Pulse Generator Implant Date: 20160724
Lead Channel Impedance Value: 285 Ohm
Lead Channel Impedance Value: 323 Ohm
Lead Channel Impedance Value: 323 Ohm
Lead Channel Impedance Value: 361 Ohm
Lead Channel Pacing Threshold Amplitude: 0.625 V
Lead Channel Pacing Threshold Amplitude: 0.625 V
Lead Channel Pacing Threshold Pulse Width: 0.4 ms
Lead Channel Pacing Threshold Pulse Width: 0.4 ms
Lead Channel Sensing Intrinsic Amplitude: 0.625 mV
Lead Channel Sensing Intrinsic Amplitude: 0.625 mV
Lead Channel Sensing Intrinsic Amplitude: 4.25 mV
Lead Channel Sensing Intrinsic Amplitude: 4.25 mV
Lead Channel Setting Pacing Amplitude: 2.5 V
Lead Channel Setting Pacing Pulse Width: 0.4 ms
Lead Channel Setting Sensing Sensitivity: 0.9 mV
Zone Setting Status: 755011
Zone Setting Status: 755011

## 2022-10-14 ENCOUNTER — Encounter: Payer: Self-pay | Admitting: Cardiovascular Disease

## 2022-11-09 NOTE — Progress Notes (Signed)
Remote pacemaker transmission.   

## 2022-11-13 ENCOUNTER — Ambulatory Visit (INDEPENDENT_AMBULATORY_CARE_PROVIDER_SITE_OTHER): Payer: Medicare Other

## 2022-11-13 DIAGNOSIS — I495 Sick sinus syndrome: Secondary | ICD-10-CM

## 2022-11-13 LAB — CUP PACEART REMOTE DEVICE CHECK
Battery Remaining Longevity: 5 mo
Battery Voltage: 2.86 V
Brady Statistic RA Percent Paced: 0 %
Brady Statistic RV Percent Paced: 94.12 %
Date Time Interrogation Session: 20240614105218
Implantable Lead Connection Status: 753985
Implantable Lead Connection Status: 753985
Implantable Lead Implant Date: 20160724
Implantable Lead Implant Date: 20160724
Implantable Lead Location: 753859
Implantable Lead Location: 753860
Implantable Lead Model: 5076
Implantable Lead Model: 5076
Implantable Pulse Generator Implant Date: 20160724
Lead Channel Impedance Value: 285 Ohm
Lead Channel Impedance Value: 323 Ohm
Lead Channel Impedance Value: 323 Ohm
Lead Channel Impedance Value: 380 Ohm
Lead Channel Pacing Threshold Amplitude: 0.625 V
Lead Channel Pacing Threshold Amplitude: 0.75 V
Lead Channel Pacing Threshold Pulse Width: 0.4 ms
Lead Channel Pacing Threshold Pulse Width: 0.4 ms
Lead Channel Sensing Intrinsic Amplitude: 0.5 mV
Lead Channel Sensing Intrinsic Amplitude: 0.5 mV
Lead Channel Sensing Intrinsic Amplitude: 4.625 mV
Lead Channel Sensing Intrinsic Amplitude: 4.625 mV
Lead Channel Setting Pacing Amplitude: 2.5 V
Lead Channel Setting Pacing Pulse Width: 0.4 ms
Lead Channel Setting Sensing Sensitivity: 0.9 mV
Zone Setting Status: 755011
Zone Setting Status: 755011

## 2022-12-01 NOTE — Progress Notes (Signed)
Remote pacemaker transmission.   

## 2022-12-14 ENCOUNTER — Ambulatory Visit (INDEPENDENT_AMBULATORY_CARE_PROVIDER_SITE_OTHER): Payer: Medicare Other

## 2022-12-14 DIAGNOSIS — I495 Sick sinus syndrome: Secondary | ICD-10-CM

## 2022-12-15 LAB — CUP PACEART REMOTE DEVICE CHECK
Battery Remaining Longevity: 3 mo
Battery Voltage: 2.85 V
Brady Statistic RA Percent Paced: 0 %
Brady Statistic RV Percent Paced: 92.06 %
Date Time Interrogation Session: 20240716123746
Implantable Lead Connection Status: 753985
Implantable Lead Connection Status: 753985
Implantable Lead Implant Date: 20160724
Implantable Lead Implant Date: 20160724
Implantable Lead Location: 753859
Implantable Lead Location: 753860
Implantable Lead Model: 5076
Implantable Lead Model: 5076
Implantable Pulse Generator Implant Date: 20160724
Lead Channel Impedance Value: 285 Ohm
Lead Channel Impedance Value: 323 Ohm
Lead Channel Impedance Value: 323 Ohm
Lead Channel Impedance Value: 361 Ohm
Lead Channel Pacing Threshold Amplitude: 0.625 V
Lead Channel Pacing Threshold Amplitude: 0.625 V
Lead Channel Pacing Threshold Pulse Width: 0.4 ms
Lead Channel Pacing Threshold Pulse Width: 0.4 ms
Lead Channel Sensing Intrinsic Amplitude: 0.375 mV
Lead Channel Sensing Intrinsic Amplitude: 0.375 mV
Lead Channel Sensing Intrinsic Amplitude: 4.75 mV
Lead Channel Sensing Intrinsic Amplitude: 4.75 mV
Lead Channel Setting Pacing Amplitude: 2.5 V
Lead Channel Setting Pacing Pulse Width: 0.4 ms
Lead Channel Setting Sensing Sensitivity: 0.9 mV
Zone Setting Status: 755011
Zone Setting Status: 755011

## 2022-12-28 NOTE — Progress Notes (Signed)
Remote pacemaker transmission.   

## 2022-12-28 NOTE — Addendum Note (Signed)
Addended by: Geralyn Flash D on: 12/28/2022 01:18 PM   Modules accepted: Level of Service

## 2023-01-11 ENCOUNTER — Ambulatory Visit: Payer: Medicare Other

## 2023-01-14 ENCOUNTER — Ambulatory Visit (INDEPENDENT_AMBULATORY_CARE_PROVIDER_SITE_OTHER): Payer: Medicare Other

## 2023-01-14 DIAGNOSIS — I495 Sick sinus syndrome: Secondary | ICD-10-CM | POA: Diagnosis not present

## 2023-01-14 LAB — CUP PACEART REMOTE DEVICE CHECK
Battery Remaining Longevity: 3 mo
Battery Voltage: 2.84 V
Brady Statistic RA Percent Paced: 0 %
Brady Statistic RV Percent Paced: 92.09 %
Date Time Interrogation Session: 20240815094405
Implantable Lead Connection Status: 753985
Implantable Lead Connection Status: 753985
Implantable Lead Implant Date: 20160724
Implantable Lead Implant Date: 20160724
Implantable Lead Location: 753859
Implantable Lead Location: 753860
Implantable Lead Model: 5076
Implantable Lead Model: 5076
Implantable Pulse Generator Implant Date: 20160724
Lead Channel Impedance Value: 266 Ohm
Lead Channel Impedance Value: 304 Ohm
Lead Channel Impedance Value: 304 Ohm
Lead Channel Impedance Value: 361 Ohm
Lead Channel Pacing Threshold Amplitude: 0.625 V
Lead Channel Pacing Threshold Amplitude: 0.75 V
Lead Channel Pacing Threshold Pulse Width: 0.4 ms
Lead Channel Pacing Threshold Pulse Width: 0.4 ms
Lead Channel Sensing Intrinsic Amplitude: 0.5 mV
Lead Channel Sensing Intrinsic Amplitude: 0.5 mV
Lead Channel Sensing Intrinsic Amplitude: 4.125 mV
Lead Channel Sensing Intrinsic Amplitude: 4.125 mV
Lead Channel Setting Pacing Amplitude: 2.5 V
Lead Channel Setting Pacing Pulse Width: 0.4 ms
Lead Channel Setting Sensing Sensitivity: 0.9 mV
Zone Setting Status: 755011
Zone Setting Status: 755011

## 2023-01-26 NOTE — Progress Notes (Signed)
Remote pacemaker transmission.   

## 2023-02-15 ENCOUNTER — Ambulatory Visit (INDEPENDENT_AMBULATORY_CARE_PROVIDER_SITE_OTHER): Payer: Medicare Other

## 2023-02-15 DIAGNOSIS — I495 Sick sinus syndrome: Secondary | ICD-10-CM

## 2023-02-16 LAB — CUP PACEART REMOTE DEVICE CHECK
Battery Remaining Longevity: 2 mo
Battery Voltage: 2.84 V
Brady Statistic RA Percent Paced: 0 %
Brady Statistic RV Percent Paced: 93 %
Date Time Interrogation Session: 20240916162206
Implantable Lead Connection Status: 753985
Implantable Lead Connection Status: 753985
Implantable Lead Implant Date: 20160724
Implantable Lead Implant Date: 20160724
Implantable Lead Location: 753859
Implantable Lead Location: 753860
Implantable Lead Model: 5076
Implantable Lead Model: 5076
Implantable Pulse Generator Implant Date: 20160724
Lead Channel Impedance Value: 266 Ohm
Lead Channel Impedance Value: 304 Ohm
Lead Channel Impedance Value: 323 Ohm
Lead Channel Impedance Value: 361 Ohm
Lead Channel Pacing Threshold Amplitude: 0.625 V
Lead Channel Pacing Threshold Amplitude: 0.625 V
Lead Channel Pacing Threshold Pulse Width: 0.4 ms
Lead Channel Pacing Threshold Pulse Width: 0.4 ms
Lead Channel Sensing Intrinsic Amplitude: 0.375 mV
Lead Channel Sensing Intrinsic Amplitude: 0.375 mV
Lead Channel Sensing Intrinsic Amplitude: 4.125 mV
Lead Channel Sensing Intrinsic Amplitude: 4.125 mV
Lead Channel Setting Pacing Amplitude: 2.5 V
Lead Channel Setting Pacing Pulse Width: 0.4 ms
Lead Channel Setting Sensing Sensitivity: 0.9 mV
Zone Setting Status: 755011
Zone Setting Status: 755011

## 2023-03-01 NOTE — Progress Notes (Signed)
Remote pacemaker transmission.   

## 2023-03-01 NOTE — Addendum Note (Signed)
Addended by: Geralyn Flash D on: 03/01/2023 04:12 PM   Modules accepted: Level of Service

## 2023-03-18 ENCOUNTER — Ambulatory Visit (INDEPENDENT_AMBULATORY_CARE_PROVIDER_SITE_OTHER): Payer: Medicare Other

## 2023-03-18 DIAGNOSIS — I495 Sick sinus syndrome: Secondary | ICD-10-CM

## 2023-03-18 LAB — CUP PACEART REMOTE DEVICE CHECK
Battery Remaining Longevity: 1 mo
Battery Voltage: 2.83 V
Brady Statistic RA Percent Paced: 0 %
Brady Statistic RV Percent Paced: 95.98 %
Date Time Interrogation Session: 20241017155337
Implantable Lead Connection Status: 753985
Implantable Lead Connection Status: 753985
Implantable Lead Implant Date: 20160724
Implantable Lead Implant Date: 20160724
Implantable Lead Location: 753859
Implantable Lead Location: 753860
Implantable Lead Model: 5076
Implantable Lead Model: 5076
Implantable Pulse Generator Implant Date: 20160724
Lead Channel Impedance Value: 266 Ohm
Lead Channel Impedance Value: 304 Ohm
Lead Channel Impedance Value: 323 Ohm
Lead Channel Impedance Value: 361 Ohm
Lead Channel Pacing Threshold Amplitude: 0.625 V
Lead Channel Pacing Threshold Amplitude: 0.75 V
Lead Channel Pacing Threshold Pulse Width: 0.4 ms
Lead Channel Pacing Threshold Pulse Width: 0.4 ms
Lead Channel Sensing Intrinsic Amplitude: 0.5 mV
Lead Channel Sensing Intrinsic Amplitude: 0.5 mV
Lead Channel Sensing Intrinsic Amplitude: 3.75 mV
Lead Channel Sensing Intrinsic Amplitude: 3.75 mV
Lead Channel Setting Pacing Amplitude: 2.5 V
Lead Channel Setting Pacing Pulse Width: 0.4 ms
Lead Channel Setting Sensing Sensitivity: 0.9 mV
Zone Setting Status: 755011
Zone Setting Status: 755011

## 2023-03-23 ENCOUNTER — Telehealth: Payer: Self-pay

## 2023-03-23 NOTE — Telephone Encounter (Signed)
Darl Pikes was returning nurse Jessica's call as requested and is requesting a callback. Please advise

## 2023-03-23 NOTE — Telephone Encounter (Signed)
Attempted to contact patient to schedule device generator change, left message to call our office back. Possibly Thursday, 04/15/23, if that date works for the patient and family.

## 2023-03-31 NOTE — Progress Notes (Unsigned)
  Electrophysiology Office Note:   ID:  [name], DOB 10/02/1924, MRN [id number]  Primary Cardiologist: None Electrophysiologist: Maurice Small, MD  {Click to update primary MD,subspecialty MD or APP then REFRESH:1}    History of Present Illness:   [name] is a 89 y.o. male with h/o symptomatic bradycardia s/p pacing, Longstanding persistent AF, and DOE seen today for acute visit due to device at [facility].    Patient reports ***.  he denies chest pain, palpitations, dyspnea, PND, orthopnea, nausea, vomiting, dizziness, syncope, edema, weight gain, or early satiety.   Review of systems complete and found to be negative unless listed in HPI.   EP Information / Studies Reviewed:    EKG is ordered today. Personal review as below.       PPM Interrogation-  reviewed in detail today,  See PACEART report.  Device History: MDT dual chamber PPM implanted 2016 for SSS   Physical Exam:   VS:  There were no vitals taken for this visit.   Wt Readings from Last 3 Encounters:  04/06/22 164 lb 6.4 oz (74.6 kg)  04/03/21 201 lb 9.6 oz (91.4 kg)  03/06/21 158 lb 1.6 oz (71.7 kg)     GEN: Well nourished, well developed in no acute distress NECK: No JVD; No carotid bruits CARDIAC: {EPRHYTHM:28826}, no murmurs, rubs, gallops RESPIRATORY:  Clear to auscultation without rales, wheezing or rhonchi  ABDOMEN: Soft, non-tender, non-distended EXTREMITIES:  No edema; No deformity   ASSESSMENT AND PLAN:    Symptomatic bradycardia s/p Medtronic PPM  Device at ERI as of *** Normal PPM function See Pace Art report No changes today  Longstanding persistent AF Rates controlled Continue eliquis for CHA2DS2VASc  of at least 2.  Labs today for procedure.  {Click here to Review PMH, Prob List, Meds, Allergies, SHx, FHx  :1}   Disposition:   Follow up with {EPPROVIDERS:28135} {EPFOLLOW UP:28173}  Signed, Graciella Freer, PA-C

## 2023-03-31 NOTE — H&P (View-Only) (Signed)
  Electrophysiology Office Note:   ID:  [name], DOB 10/02/1924, MRN [id number]  Primary Cardiologist: None Electrophysiologist: Maurice Small, MD  {Click to update primary MD,subspecialty MD or APP then REFRESH:1}    History of Present Illness:   [name] is a 89 y.o. male with h/o symptomatic bradycardia s/p pacing, Longstanding persistent AF, and DOE seen today for acute visit due to device at [facility].    Patient reports ***.  he denies chest pain, palpitations, dyspnea, PND, orthopnea, nausea, vomiting, dizziness, syncope, edema, weight gain, or early satiety.   Review of systems complete and found to be negative unless listed in HPI.   EP Information / Studies Reviewed:    EKG is ordered today. Personal review as below.       PPM Interrogation-  reviewed in detail today,  See PACEART report.  Device History: MDT dual chamber PPM implanted 2016 for SSS   Physical Exam:   VS:  There were no vitals taken for this visit.   Wt Readings from Last 3 Encounters:  04/06/22 164 lb 6.4 oz (74.6 kg)  04/03/21 201 lb 9.6 oz (91.4 kg)  03/06/21 158 lb 1.6 oz (71.7 kg)     GEN: Well nourished, well developed in no acute distress NECK: No JVD; No carotid bruits CARDIAC: {EPRHYTHM:28826}, no murmurs, rubs, gallops RESPIRATORY:  Clear to auscultation without rales, wheezing or rhonchi  ABDOMEN: Soft, non-tender, non-distended EXTREMITIES:  No edema; No deformity   ASSESSMENT AND PLAN:    Symptomatic bradycardia s/p Medtronic PPM  Device at ERI as of *** Normal PPM function See Pace Art report No changes today  Longstanding persistent AF Rates controlled Continue eliquis for CHA2DS2VASc  of at least 2.  Labs today for procedure.  {Click here to Review PMH, Prob List, Meds, Allergies, SHx, FHx  :1}   Disposition:   Follow up with {EPPROVIDERS:28135} {EPFOLLOW UP:28173}  Signed, Graciella Freer, PA-C

## 2023-04-01 ENCOUNTER — Ambulatory Visit: Payer: Medicare Other | Attending: Student | Admitting: Student

## 2023-04-01 ENCOUNTER — Encounter: Payer: Self-pay | Admitting: Student

## 2023-04-01 ENCOUNTER — Encounter: Payer: Self-pay | Admitting: *Deleted

## 2023-04-01 VITALS — BP 108/56 | HR 77 | Ht 70.0 in | Wt 170.0 lb

## 2023-04-01 DIAGNOSIS — I4819 Other persistent atrial fibrillation: Secondary | ICD-10-CM

## 2023-04-01 DIAGNOSIS — I4811 Longstanding persistent atrial fibrillation: Secondary | ICD-10-CM | POA: Diagnosis not present

## 2023-04-01 DIAGNOSIS — I495 Sick sinus syndrome: Secondary | ICD-10-CM

## 2023-04-01 LAB — CUP PACEART INCLINIC DEVICE CHECK
Battery Remaining Longevity: 1 mo
Battery Voltage: 2.83 V
Brady Statistic RA Percent Paced: 0 %
Brady Statistic RV Percent Paced: 94.56 %
Date Time Interrogation Session: 20241031103647
Implantable Lead Connection Status: 753985
Implantable Lead Connection Status: 753985
Implantable Lead Implant Date: 20160724
Implantable Lead Implant Date: 20160724
Implantable Lead Location: 753859
Implantable Lead Location: 753860
Implantable Lead Model: 5076
Implantable Lead Model: 5076
Implantable Pulse Generator Implant Date: 20160724
Lead Channel Impedance Value: 285 Ohm
Lead Channel Impedance Value: 323 Ohm
Lead Channel Impedance Value: 342 Ohm
Lead Channel Impedance Value: 380 Ohm
Lead Channel Pacing Threshold Amplitude: 0.625 V
Lead Channel Pacing Threshold Amplitude: 0.625 V
Lead Channel Pacing Threshold Pulse Width: 0.4 ms
Lead Channel Pacing Threshold Pulse Width: 0.4 ms
Lead Channel Sensing Intrinsic Amplitude: 0.5 mV
Lead Channel Sensing Intrinsic Amplitude: 0.75 mV
Lead Channel Sensing Intrinsic Amplitude: 3.875 mV
Lead Channel Sensing Intrinsic Amplitude: 3.875 mV
Lead Channel Setting Pacing Amplitude: 2.5 V
Lead Channel Setting Pacing Pulse Width: 0.4 ms
Lead Channel Setting Sensing Sensitivity: 0.9 mV
Zone Setting Status: 755011
Zone Setting Status: 755011

## 2023-04-01 MED ORDER — APIXABAN 5 MG PO TABS
5.0000 mg | ORAL_TABLET | Freq: Two times a day (BID) | ORAL | 3 refills | Status: DC
Start: 2023-04-01 — End: 2024-04-11

## 2023-04-01 NOTE — Patient Instructions (Signed)
Medication Instructions:  Your physician recommends that you continue on your current medications as directed. Please refer to the Current Medication list given to you today.  *If you need a refill on your cardiac medications before your next appointment, please call your pharmacy*  Lab Work: BMET, CBC-TODAY If you have labs (blood work) drawn today and your tests are completely normal, you will receive your results only by: MyChart Message (if you have MyChart) OR A paper copy in the mail If you have any lab test that is abnormal or we need to change your treatment, we will call you to review the results.  Testing/Procedures: See letter  Follow-Up: At [facility], you and your health needs are our priority.  As part of our continuing mission to provide you with exceptional heart care, we have created designated Provider Care Teams.  These Care Teams include your primary Cardiologist (physician) and Advanced Practice Providers (APPs -  Physician Assistants and Nurse Practitioners) who all work together to provide you with the care you need, when you need it.  Your next appointment:   Your follow up appointments will be scheduled for you and print out on your discharge summary after your procedure.

## 2023-04-02 ENCOUNTER — Other Ambulatory Visit: Payer: Self-pay | Admitting: Cardiovascular Disease

## 2023-04-02 DIAGNOSIS — I4819 Other persistent atrial fibrillation: Secondary | ICD-10-CM

## 2023-04-02 LAB — BASIC METABOLIC PANEL
BUN/Creatinine Ratio: 19 (ref 10–24)
BUN: 24 mg/dL (ref 10–36)
CO2: 25 mmol/L (ref 20–29)
Calcium: 8.8 mg/dL (ref 8.6–10.2)
Chloride: 108 mmol/L — ABNORMAL HIGH (ref 96–106)
Creatinine, Ser: 1.25 mg/dL (ref 0.76–1.27)
Glucose: 88 mg/dL (ref 70–99)
Potassium: 4.6 mmol/L (ref 3.5–5.2)
Sodium: 140 mmol/L (ref 134–144)
eGFR: 53 mL/min/{1.73_m2} — ABNORMAL LOW (ref >59)

## 2023-04-02 LAB — CBC
Hematocrit: 36.1 % — ABNORMAL LOW (ref 37.5–51.0)
Hemoglobin: 11.7 g/dL — ABNORMAL LOW (ref 13.0–17.7)
MCH: 32.3 pg (ref 26.6–33.0)
MCHC: 32.4 g/dL (ref 31.5–35.7)
MCV: 100 fL — ABNORMAL HIGH (ref 79–97)
Platelets: 196 10*3/uL (ref 150–450)
RBC: 3.62 x10E6/uL — ABNORMAL LOW (ref 4.14–5.80)
RDW: 12.2 % (ref 11.6–15.4)
WBC: 9.7 10*3/uL (ref 3.4–10.8)

## 2023-04-07 NOTE — Progress Notes (Signed)
Remote pacemaker transmission.   

## 2023-04-12 ENCOUNTER — Ambulatory Visit: Payer: Medicare Other

## 2023-04-14 NOTE — Pre-Procedure Instructions (Signed)
Instructed patient on the following items: Arrival time 0515 Nothing to eat or drink after midnight No meds AM of procedure Responsible person to drive you home and stay with you for 24 hrs Wash with special soap night before and morning of procedure If on anti-coagulant drug instructions Eliquis- last dose 11/11

## 2023-04-15 ENCOUNTER — Other Ambulatory Visit: Payer: Self-pay

## 2023-04-15 ENCOUNTER — Ambulatory Visit (HOSPITAL_COMMUNITY)
Admission: RE | Admit: 2023-04-15 | Discharge: 2023-04-15 | Disposition: A | Discharge status: Home / Self Care | Payer: Medicare Other | Attending: Cardiovascular Disease | Admitting: Cardiovascular Disease

## 2023-04-15 ENCOUNTER — Ambulatory Visit (HOSPITAL_COMMUNITY)
Admission: RE | Disposition: A | Discharge status: Home / Self Care | Payer: Self-pay | Source: Home / Self Care | Attending: Cardiovascular Disease

## 2023-04-15 ENCOUNTER — Encounter (HOSPITAL_COMMUNITY): Payer: Self-pay | Admitting: Cardiovascular Disease

## 2023-04-15 DIAGNOSIS — I4811 Longstanding persistent atrial fibrillation: Secondary | ICD-10-CM | POA: Diagnosis not present

## 2023-04-15 DIAGNOSIS — Z45010 Encounter for checking and testing of cardiac pacemaker pulse generator [battery]: Secondary | ICD-10-CM | POA: Diagnosis present

## 2023-04-15 DIAGNOSIS — I442 Atrioventricular block, complete: Secondary | ICD-10-CM

## 2023-04-15 DIAGNOSIS — R0609 Other forms of dyspnea: Secondary | ICD-10-CM | POA: Diagnosis not present

## 2023-04-15 DIAGNOSIS — I4819 Other persistent atrial fibrillation: Secondary | ICD-10-CM

## 2023-04-15 DIAGNOSIS — Z7901 Long term (current) use of anticoagulants: Secondary | ICD-10-CM | POA: Diagnosis not present

## 2023-04-15 DIAGNOSIS — R001 Bradycardia, unspecified: Secondary | ICD-10-CM | POA: Insufficient documentation

## 2023-04-15 DIAGNOSIS — I447 Left bundle-branch block, unspecified: Secondary | ICD-10-CM | POA: Insufficient documentation

## 2023-04-15 HISTORY — PX: PPM GENERATOR CHANGEOUT: EP1233

## 2023-04-15 SURGERY — PPM GENERATOR CHANGEOUT

## 2023-04-15 MED ORDER — POVIDONE-IODINE 10 % EX SWAB
2.0000 | Freq: Once | CUTANEOUS | Status: AC
  Administered 2023-04-15: 2 via TOPICAL

## 2023-04-15 MED ORDER — CEFAZOLIN SODIUM-DEXTROSE 2-4 GM/100ML-% IV SOLN
INTRAVENOUS | Status: AC
  Administered 2023-04-15: 2 g via INTRAVENOUS
  Filled 2023-04-15: qty 100

## 2023-04-15 MED ORDER — FENTANYL CITRATE (PF) 100 MCG/2ML IJ SOLN
INTRAMUSCULAR | Status: AC
  Filled 2023-04-15: qty 2

## 2023-04-15 MED ORDER — MIDAZOLAM HCL 5 MG/5ML IJ SOLN
INTRAMUSCULAR | Status: DC | PRN
  Administered 2023-04-15: 1 mg via INTRAVENOUS

## 2023-04-15 MED ORDER — CEFAZOLIN SODIUM-DEXTROSE 2-4 GM/100ML-% IV SOLN: 2.0000 g | INTRAVENOUS | Status: AC

## 2023-04-15 MED ORDER — CHLORHEXIDINE GLUCONATE 4 % EX SOLN: 4.0000 | Freq: Once | CUTANEOUS | Status: DC

## 2023-04-15 MED ORDER — ONDANSETRON HCL 4 MG/2ML IJ SOLN: 4.0000 mg | Freq: Four times a day (QID) | INTRAMUSCULAR | Status: DC | PRN

## 2023-04-15 MED ORDER — LIDOCAINE HCL (PF) 1 % IJ SOLN
INTRAMUSCULAR | Status: DC | PRN
  Administered 2023-04-15: 60 mL

## 2023-04-15 MED ORDER — SODIUM CHLORIDE 0.9 % IV SOLN
INTRAVENOUS | Status: AC
  Administered 2023-04-15: 80 mg
  Filled 2023-04-15: qty 2

## 2023-04-15 MED ORDER — LIDOCAINE HCL (PF) 1 % IJ SOLN
INTRAMUSCULAR | Status: AC
  Filled 2023-04-15: qty 60

## 2023-04-15 MED ORDER — FENTANYL CITRATE (PF) 100 MCG/2ML IJ SOLN
INTRAMUSCULAR | Status: DC | PRN
  Administered 2023-04-15: 25 ug via INTRAVENOUS

## 2023-04-15 MED ORDER — SODIUM CHLORIDE 0.9 % IV SOLN: INTRAVENOUS | Status: DC

## 2023-04-15 MED ORDER — MIDAZOLAM HCL 2 MG/2ML IJ SOLN
INTRAMUSCULAR | Status: AC
Start: 2023-04-15 — End: ?
  Filled 2023-04-15: qty 2

## 2023-04-15 MED ORDER — ACETAMINOPHEN 325 MG PO TABS: 325.0000 mg | ORAL_TABLET | ORAL | Status: DC | PRN

## 2023-04-15 MED ORDER — SODIUM CHLORIDE 0.9 % IV SOLN: 80.0000 mg | INTRAVENOUS | Status: AC

## 2023-04-15 SURGICAL SUPPLY — 8 items
CABLE SURGICAL S-101-97-12 (CABLE) ×1 IMPLANT
IPG PACE AZUR XT DR MRI W1DR01 (Pacemaker) IMPLANT
KIT WRENCH (KITS) IMPLANT
PACE AZURE XT DR MRI W1DR01 (Pacemaker) ×1 IMPLANT
PAD DEFIB RADIO PHYSIO CONN (PAD) ×1 IMPLANT
POUCH AIGIS-R ANTIBACT PPM (Mesh General) ×1 IMPLANT
POUCH AIGIS-R ANTIBACT PPM MED (Mesh General) IMPLANT
TRAY PACEMAKER INSERTION (PACKS) ×1 IMPLANT

## 2023-04-15 NOTE — Interval H&P Note (Signed)
History and Physical Interval Note:  04/15/2023 7:18 AM  [name]  has presented today for surgery, with the diagnosis of eri.  The various methods of treatment have been discussed with the patient and family. After consideration of risks, benefits and other options for treatment, the patient has consented to  Procedure(s): PPM GENERATOR CHANGEOUT (N/A) as a surgical intervention.  The patient's history has been reviewed, patient examined, no change in status, stable for surgery.  I have reviewed the patient's chart and labs.  Questions were answered to the patient's satisfaction.     Roberts Gaudy Stacie Templin

## 2023-04-15 NOTE — Discharge Instructions (Signed)

## 2023-04-16 ENCOUNTER — Telehealth: Payer: Self-pay | Admitting: Cardiovascular Disease

## 2023-04-16 MED FILL — Midazolam HCl Inj 2 MG/2ML (Base Equivalent): INTRAMUSCULAR | Qty: 1 | Status: AC

## 2023-04-16 NOTE — Telephone Encounter (Signed)
Daughter Darl Pikes) wants to confirm if patient can have a shower after his procedure or if he will need to wait 7 days after bandage comes off.

## 2023-04-16 NOTE — Telephone Encounter (Signed)
Spoke to

## 2023-04-16 NOTE — Telephone Encounter (Signed)
Spoke to patients daughter. Advised no shower for patient until wound check Voiced understanding and appreciative of call.

## 2023-04-22 NOTE — Telephone Encounter (Signed)
Called patient to get him to send a transmission before his wound check appt with no answer and unable to leave vm since his inbox was full. Will send mychart message

## 2023-04-27 NOTE — Telephone Encounter (Signed)
Patient was called and transmission received. Pt doesnt have questions about wound care.

## 2023-04-27 NOTE — Patient Instructions (Signed)

## 2023-04-28 ENCOUNTER — Other Ambulatory Visit: Payer: Self-pay

## 2023-04-28 ENCOUNTER — Ambulatory Visit: Payer: Medicare Other | Attending: Internal Medicine

## 2023-04-28 ENCOUNTER — Inpatient Hospital Stay (HOSPITAL_COMMUNITY)
Admission: EM | Admit: 2023-04-28 | Discharge: 2023-04-30 | DRG: 292 | Disposition: A | Discharge status: Home / Self Care | Payer: Medicare Other | Source: Skilled Nursing Facility | Attending: Internal Medicine | Admitting: Internal Medicine

## 2023-04-28 ENCOUNTER — Inpatient Hospital Stay (HOSPITAL_COMMUNITY): Payer: Medicare Other

## 2023-04-28 ENCOUNTER — Emergency Department (HOSPITAL_COMMUNITY): Payer: Medicare Other

## 2023-04-28 VITALS — BP 105/62 | HR 62

## 2023-04-28 DIAGNOSIS — H3530 Unspecified macular degeneration: Secondary | ICD-10-CM | POA: Diagnosis present

## 2023-04-28 DIAGNOSIS — R001 Bradycardia, unspecified: Secondary | ICD-10-CM | POA: Diagnosis present

## 2023-04-28 DIAGNOSIS — M810 Age-related osteoporosis without current pathological fracture: Secondary | ICD-10-CM | POA: Diagnosis present

## 2023-04-28 DIAGNOSIS — Z809 Family history of malignant neoplasm, unspecified: Secondary | ICD-10-CM | POA: Diagnosis not present

## 2023-04-28 DIAGNOSIS — N1831 Chronic kidney disease, stage 3a: Secondary | ICD-10-CM | POA: Diagnosis present

## 2023-04-28 DIAGNOSIS — I495 Sick sinus syndrome: Secondary | ICD-10-CM | POA: Diagnosis present

## 2023-04-28 DIAGNOSIS — N179 Acute kidney failure, unspecified: Secondary | ICD-10-CM | POA: Diagnosis present

## 2023-04-28 DIAGNOSIS — I5043 Acute on chronic combined systolic (congestive) and diastolic (congestive) heart failure: Secondary | ICD-10-CM | POA: Diagnosis not present

## 2023-04-28 DIAGNOSIS — I509 Heart failure, unspecified: Secondary | ICD-10-CM | POA: Diagnosis present

## 2023-04-28 DIAGNOSIS — I4821 Permanent atrial fibrillation: Secondary | ICD-10-CM | POA: Diagnosis present

## 2023-04-28 DIAGNOSIS — Z79899 Other long term (current) drug therapy: Secondary | ICD-10-CM

## 2023-04-28 DIAGNOSIS — I5033 Acute on chronic diastolic (congestive) heart failure: Secondary | ICD-10-CM | POA: Insufficient documentation

## 2023-04-28 DIAGNOSIS — Z8249 Family history of ischemic heart disease and other diseases of the circulatory system: Secondary | ICD-10-CM | POA: Diagnosis not present

## 2023-04-28 DIAGNOSIS — Z823 Family history of stroke: Secondary | ICD-10-CM | POA: Diagnosis not present

## 2023-04-28 DIAGNOSIS — I442 Atrioventricular block, complete: Secondary | ICD-10-CM | POA: Diagnosis not present

## 2023-04-28 DIAGNOSIS — Z950 Presence of cardiac pacemaker: Secondary | ICD-10-CM

## 2023-04-28 DIAGNOSIS — Z7901 Long term (current) use of anticoagulants: Secondary | ICD-10-CM

## 2023-04-28 DIAGNOSIS — I5031 Acute diastolic (congestive) heart failure: Principal | ICD-10-CM | POA: Diagnosis present

## 2023-04-28 DIAGNOSIS — M79604 Pain in right leg: Secondary | ICD-10-CM

## 2023-04-28 DIAGNOSIS — J9811 Atelectasis: Secondary | ICD-10-CM | POA: Diagnosis present

## 2023-04-28 DIAGNOSIS — R0902 Hypoxemia: Secondary | ICD-10-CM | POA: Diagnosis present

## 2023-04-28 DIAGNOSIS — D509 Iron deficiency anemia, unspecified: Secondary | ICD-10-CM

## 2023-04-28 LAB — CBC WITH DIFFERENTIAL/PLATELET
Abs Immature Granulocytes: 0.02 10*3/uL (ref 0.00–0.07)
Basophils Absolute: 0.1 10*3/uL (ref 0.0–0.1)
Basophils Relative: 1 %
Eosinophils Absolute: 0.2 10*3/uL (ref 0.0–0.5)
Eosinophils Relative: 3 %
HCT: 34 % — ABNORMAL LOW (ref 39.0–52.0)
Hemoglobin: 11.2 g/dL — ABNORMAL LOW (ref 13.0–17.0)
Immature Granulocytes: 0 %
Lymphocytes Relative: 25 %
Lymphs Abs: 1.9 10*3/uL (ref 0.7–4.0)
MCH: 32.5 pg (ref 26.0–34.0)
MCHC: 32.9 g/dL (ref 30.0–36.0)
MCV: 98.6 fL (ref 80.0–100.0)
Monocytes Absolute: 1 10*3/uL (ref 0.1–1.0)
Monocytes Relative: 13 %
Neutro Abs: 4.4 10*3/uL (ref 1.7–7.7)
Neutrophils Relative %: 58 %
Platelets: 197 10*3/uL (ref 150–400)
RBC: 3.45 MIL/uL — ABNORMAL LOW (ref 4.22–5.81)
RDW: 14.8 % (ref 11.5–15.5)
WBC: 7.5 10*3/uL (ref 4.0–10.5)
nRBC: 0 % (ref 0.0–0.2)

## 2023-04-28 LAB — ECHOCARDIOGRAM COMPLETE
AR max vel: 2.38 cm2
AV Area VTI: 2.45 cm2
AV Area mean vel: 2.21 cm2
AV Mean grad: 2 mm[Hg]
AV Peak grad: 4.1 mm[Hg]
Ao pk vel: 1.01 m/s
Area-P 1/2: 4.8 cm2
S' Lateral: 4.2 cm

## 2023-04-28 LAB — CUP PACEART INCLINIC DEVICE CHECK
Battery Remaining Longevity: 154 mo
Battery Voltage: 3.23 V
Brady Statistic AP VP Percent: 0 %
Brady Statistic AP VS Percent: 0 %
Brady Statistic AS VP Percent: 96.61 %
Brady Statistic AS VS Percent: 3.39 %
Brady Statistic RA Percent Paced: 0 %
Brady Statistic RV Percent Paced: 96.61 %
Date Time Interrogation Session: 20241127110107
Implantable Lead Connection Status: 753985
Implantable Lead Connection Status: 753985
Implantable Lead Implant Date: 20160724
Implantable Lead Implant Date: 20160724
Implantable Lead Location: 753859
Implantable Lead Location: 753860
Implantable Lead Model: 5076
Implantable Lead Model: 5076
Implantable Pulse Generator Implant Date: 20241114
Lead Channel Impedance Value: 266 Ohm
Lead Channel Impedance Value: 342 Ohm
Lead Channel Impedance Value: 342 Ohm
Lead Channel Impedance Value: 342 Ohm
Lead Channel Impedance Value: 399 Ohm
Lead Channel Pacing Threshold Amplitude: 0.625 V
Lead Channel Pacing Threshold Pulse Width: 0.4 ms
Lead Channel Sensing Intrinsic Amplitude: 0 mV
Lead Channel Sensing Intrinsic Amplitude: 0 mV
Lead Channel Sensing Intrinsic Amplitude: 0.75 mV
Lead Channel Sensing Intrinsic Amplitude: 5.875 mV
Lead Channel Sensing Intrinsic Amplitude: 5.875 mV
Lead Channel Setting Pacing Amplitude: 2 V
Lead Channel Setting Pacing Pulse Width: 0.4 ms
Lead Channel Setting Sensing Sensitivity: 2.8 mV
Zone Setting Status: 755011

## 2023-04-28 LAB — COMPREHENSIVE METABOLIC PANEL
ALT: 15 U/L (ref 0–44)
AST: 25 U/L (ref 15–41)
Albumin: 2.8 g/dL — ABNORMAL LOW (ref 3.5–5.0)
Alkaline Phosphatase: 56 U/L (ref 38–126)
Anion gap: 6 (ref 5–15)
BUN: 29 mg/dL — ABNORMAL HIGH (ref 8–23)
CO2: 25 mmol/L (ref 22–32)
Calcium: 8.6 mg/dL — ABNORMAL LOW (ref 8.9–10.3)
Chloride: 106 mmol/L (ref 98–111)
Creatinine, Ser: 1.28 mg/dL — ABNORMAL HIGH (ref 0.61–1.24)
GFR, Estimated: 51 mL/min — ABNORMAL LOW (ref >60)
Glucose, Bld: 92 mg/dL (ref 70–99)
Potassium: 4 mmol/L (ref 3.5–5.1)
Sodium: 137 mmol/L (ref 135–145)
Total Bilirubin: 1.3 mg/dL — ABNORMAL HIGH (ref <1.2)
Total Protein: 6.6 g/dL (ref 6.5–8.1)

## 2023-04-28 LAB — TROPONIN I (HIGH SENSITIVITY)
Troponin I (High Sensitivity): 13 ng/L (ref <18)
Troponin I (High Sensitivity): 12 ng/L (ref <18)

## 2023-04-28 LAB — URINALYSIS, ROUTINE W REFLEX MICROSCOPIC
Bacteria, UA: NONE SEEN
Bilirubin Urine: NEGATIVE
Glucose, UA: NEGATIVE mg/dL
Hgb urine dipstick: NEGATIVE
Ketones, ur: NEGATIVE mg/dL
Leukocytes,Ua: NEGATIVE
Nitrite: NEGATIVE
Protein, ur: NEGATIVE mg/dL
Specific Gravity, Urine: 1.014 (ref 1.005–1.030)
pH: 5 (ref 5.0–8.0)

## 2023-04-28 LAB — BRAIN NATRIURETIC PEPTIDE: B Natriuretic Peptide: 353.2 pg/mL — ABNORMAL HIGH (ref 0.0–100.0)

## 2023-04-28 MED ORDER — ONDANSETRON HCL 4 MG/2ML IJ SOLN: 4.0000 mg | Freq: Four times a day (QID) | INTRAMUSCULAR | Status: DC | PRN

## 2023-04-28 MED ORDER — ACETAMINOPHEN 325 MG PO TABS
650.0000 mg | ORAL_TABLET | ORAL | Status: DC | PRN
Start: 2023-04-28 — End: 2023-04-30

## 2023-04-28 MED ORDER — SODIUM CHLORIDE 0.9% FLUSH
3.0000 mL | Freq: Two times a day (BID) | INTRAVENOUS | Status: DC
  Administered 2023-04-28 – 2023-04-30 (×3): 3 mL via INTRAVENOUS

## 2023-04-28 MED ORDER — FUROSEMIDE 10 MG/ML IJ SOLN
20.0000 mg | Freq: Once | INTRAMUSCULAR | Status: AC
  Administered 2023-04-28: 20 mg via INTRAVENOUS
  Filled 2023-04-28: qty 2

## 2023-04-28 MED ORDER — TRAMADOL HCL 50 MG PO TABS: 50.0000 mg | ORAL_TABLET | Freq: Three times a day (TID) | ORAL | Status: DC | PRN

## 2023-04-28 MED ORDER — APIXABAN 5 MG PO TABS
5.0000 mg | ORAL_TABLET | Freq: Two times a day (BID) | ORAL | Status: DC
  Administered 2023-04-28: 5 mg via ORAL
  Filled 2023-04-28 (×2): qty 1

## 2023-04-28 MED ORDER — ZOLPIDEM TARTRATE 5 MG PO TABS
5.0000 mg | ORAL_TABLET | Freq: Every day | ORAL | Status: DC
  Administered 2023-04-28 – 2023-04-29 (×2): 5 mg via ORAL
  Filled 2023-04-28 (×2): qty 1

## 2023-04-28 MED ORDER — SODIUM CHLORIDE 0.9 % IV SOLN: 250.0000 mL | INTRAVENOUS | Status: AC | PRN

## 2023-04-28 MED ORDER — FUROSEMIDE 10 MG/ML IJ SOLN
20.0000 mg | Freq: Two times a day (BID) | INTRAMUSCULAR | Status: DC
  Filled 2023-04-28: qty 2

## 2023-04-28 MED ORDER — SODIUM CHLORIDE 0.9% FLUSH: 3.0000 mL | INTRAVENOUS | Status: DC | PRN

## 2023-04-28 MED ORDER — FUROSEMIDE 10 MG/ML IJ SOLN: 20.0000 mg | Freq: Two times a day (BID) | INTRAMUSCULAR | Status: DC

## 2023-04-28 MED ORDER — APIXABAN 2.5 MG PO TABS: 2.5000 mg | ORAL_TABLET | Freq: Two times a day (BID) | ORAL | Status: DC

## 2023-04-28 NOTE — ED Notes (Signed)
ED TO INPATIENT HANDOFF REPORT  ED Nurse Name and Phone #:   S Name/Age/Gender [name] 89 y.o. male Room/Bed: TRACC/TRACC  Code Status   Code Status: Prior  Home/SNF/Other Home Patient oriented to: self, place, time, and situation Is this baseline? Yes   Triage Complete: Triage complete  Chief Complaint CHF (congestive heart failure) (HCC) [I50.9]  Triage Note Pt BIB daughter from doctors appointment for increased leg swelling & decreased O2sats. Pt has a hx of Heart Failure.V/S stable upon arrival.   Allergies No Known Allergies  Level of Care/Admitting Diagnosis ED Disposition     ED Disposition  Admit   Condition  --   Comment  Hospital Area: [facility] [[id number]]  Level of Care: Telemetry Cardiac [103]  May admit patient to [facility] or [facility] if equivalent level of care is available:: No  Covid Evaluation: Asymptomatic - no recent exposure (last 10 days) testing not required  Diagnosis: CHF (congestive heart failure) [facility]) [528413]  Admitting Physician: Clydie Braun [[id number]]  Attending Physician: Clydie Braun [[id number]]  Certification:: I certify this patient will need inpatient services for at least 2 midnights  Expected Medical Readiness: 04/30/2023          B Medical/Surgery History Past Medical History:  Diagnosis Date   Macular degeneration    Osteoporosis    Sick sinus syndrome (HCC)    a. s/p MDT dual chamber PPM implant 11/2014   Past Surgical History:  Procedure Laterality Date   PACEMAKER INSERTION  11/2014   MDT dual chamber PPM implanted for SSS    PPM GENERATOR CHANGEOUT N/A 04/15/2023   Procedure: PPM GENERATOR CHANGEOUT;  Surgeon: Maurice Small, MD;  Location: [facility] INVASIVE CV LAB;  Service: Cardiovascular;  Laterality: N/A;     A IV Location/Drains/Wounds Patient Lines/Drains/Airways Status     Active Line/Drains/Airways     Name Placement date Placement time Site Days   Peripheral  IV 04/28/23 18 G Anterior;Distal;Right;Upper Arm 04/28/23  1258  Arm  less than 1            Intake/Output Last 24 hours No intake or output data in the 24 hours ending 04/28/23 1537  Labs/Imaging Results for orders placed or performed during the hospital encounter of 04/28/23 (from the past 48 hour(s))  Comprehensive metabolic panel     Status: Abnormal   Collection Time: 04/28/23 12:50 PM  Result Value Ref Range   Sodium 137 135 - 145 mmol/L   Potassium 4.0 3.5 - 5.1 mmol/L   Chloride 106 98 - 111 mmol/L   CO2 25 22 - 32 mmol/L   Glucose, Bld 92 70 - 99 mg/dL    Comment: Glucose reference range applies only to samples taken after fasting for at least 8 hours.   BUN 29 (H) 8 - 23 mg/dL   Creatinine, Ser 4.03 (H) 0.61 - 1.24 mg/dL   Calcium 8.6 (L) 8.9 - 10.3 mg/dL   Total Protein 6.6 6.5 - 8.1 g/dL   Albumin 2.8 (L) 3.5 - 5.0 g/dL   AST 25 15 - 41 U/L   ALT 15 0 - 44 U/L   Alkaline Phosphatase 56 38 - 126 U/L   Total Bilirubin 1.3 (H) <1.2 mg/dL   GFR, Estimated 51 (L) >60 mL/min    Comment: (NOTE) Calculated using the CKD-EPI Creatinine Equation (2021)    Anion gap 6 5 - 15    Comment: Performed at [facility] Lab, [address], [place], [place] [address]  Troponin I (High Sensitivity)     Status: None   Collection Time: 04/28/23 12:50 PM  Result Value Ref Range   Troponin I (High Sensitivity) 13 <18 ng/L    Comment: (NOTE) Elevated high sensitivity troponin I (hsTnI) values and significant  changes across serial measurements may suggest ACS but many other  chronic and acute conditions are known to elevate hsTnI results.  Refer to the "Links" section for chest pain algorithms and additional  guidance. Performed at [facility], [address], [place], [place] [address]   Brain natriuretic peptide     Status: Abnormal   Collection Time: 04/28/23 12:50 PM  Result Value Ref Range   B Natriuretic Peptide 353.2 (H) 0.0 - 100.0 pg/mL    Comment: Performed  at [facility], [address] [place], [place] [address]  CBC with Differential     Status: Abnormal   Collection Time: 04/28/23 12:50 PM  Result Value Ref Range   WBC 7.5 4.0 - 10.5 K/uL   RBC 3.45 (L) 4.22 - 5.81 MIL/uL   Hemoglobin 11.2 (L) 13.0 - 17.0 g/dL   HCT 41.6 (L) 60.6 - 30.1 %   MCV 98.6 80.0 - 100.0 fL   MCH 32.5 26.0 - 34.0 pg   MCHC 32.9 30.0 - 36.0 g/dL   RDW 60.1 09.3 - 23.5 %   Platelets 197 150 - 400 K/uL   nRBC 0.0 0.0 - 0.2 %   Neutrophils Relative % 58 %   Neutro Abs 4.4 1.7 - 7.7 K/uL   Lymphocytes Relative 25 %   Lymphs Abs 1.9 0.7 - 4.0 K/uL   Monocytes Relative 13 %   Monocytes Absolute 1.0 0.1 - 1.0 K/uL   Eosinophils Relative 3 %   Eosinophils Absolute 0.2 0.0 - 0.5 K/uL   Basophils Relative 1 %   Basophils Absolute 0.1 0.0 - 0.1 K/uL   Immature Granulocytes 0 %   Abs Immature Granulocytes 0.02 0.00 - 0.07 K/uL    Comment: Performed at [facility] Lab, [address] [place], [place] [address]  Urinalysis, Routine w reflex microscopic -Urine, Clean Catch     Status: None   Collection Time: 04/28/23  2:24 PM  Result Value Ref Range   Color, Urine YELLOW YELLOW   APPearance CLEAR CLEAR   Specific Gravity, Urine 1.014 1.005 - 1.030   pH 5.0 5.0 - 8.0   Glucose, UA NEGATIVE NEGATIVE mg/dL   Hgb urine dipstick NEGATIVE NEGATIVE   Bilirubin Urine NEGATIVE NEGATIVE   Ketones, ur NEGATIVE NEGATIVE mg/dL   Protein, ur NEGATIVE NEGATIVE mg/dL   Nitrite NEGATIVE NEGATIVE   Leukocytes,Ua NEGATIVE NEGATIVE   RBC / HPF 11-20 0 - 5 RBC/hpf   WBC, UA 0-5 0 - 5 WBC/hpf   Bacteria, UA NONE SEEN NONE SEEN   Squamous Epithelial / HPF 0-5 0 - 5 /HPF    Comment: Performed at [facility], [address], [place], [place] [address]   VAS [place] LOWER EXTREMITY VENOUS (DVT) (7a-7p)  Result Date: 04/28/2023  Lower Venous DVT Study Patient Name:  [name]  Date of Exam:   04/28/2023 Medical Rec #: [id number]         Accession #:    [id number]  Date of Birth: 10/17/1924          Patient Gender: M Patient Age:   70 years Exam Location:  [facility] Procedure:  VAS [place] LOWER EXTREMITY VENOUS (DVT) Referring Phys: SCOTT GOLDSTON --------------------------------------------------------------------------------  Indications: Swelling, right calf, and Edema.  Comparison Study: No priors. Performing Technologist: Marilynne Halsted RDMS, RVT  Examination Guidelines: A complete evaluation includes B-mode imaging, spectral Doppler, color Doppler, and power Doppler as needed of all accessible portions of each vessel. Bilateral testing is considered an integral part of a complete examination. Limited examinations for reoccurring indications may be performed as noted. The reflux portion of the exam is performed with the patient in reverse Trendelenburg.  +---------+---------------+---------+-----------+----------+--------------+ RIGHT    CompressibilityPhasicitySpontaneityPropertiesThrombus Aging +---------+---------------+---------+-----------+----------+--------------+ CFV      Full           Yes      Yes                                 +---------+---------------+---------+-----------+----------+--------------+ SFJ      Full                                                        +---------+---------------+---------+-----------+----------+--------------+ FV Prox  Full                                                        +---------+---------------+---------+-----------+----------+--------------+ FV Mid   Full                                                        +---------+---------------+---------+-----------+----------+--------------+ FV DistalFull                                                        +---------+---------------+---------+-----------+----------+--------------+ PFV      Full                                                         +---------+---------------+---------+-----------+----------+--------------+ POP      Full           Yes      Yes                                 +---------+---------------+---------+-----------+----------+--------------+ PTV      Full                                                        +---------+---------------+---------+-----------+----------+--------------+ PERO     Full                                                        +---------+---------------+---------+-----------+----------+--------------+   +----+---------------+---------+-----------+----------+--------------+  LEFTCompressibilityPhasicitySpontaneityPropertiesThrombus Aging +----+---------------+---------+-----------+----------+--------------+ CFV Full           Yes      Yes                                 +----+---------------+---------+-----------+----------+--------------+     Summary: RIGHT: - There is no evidence of deep vein thrombosis in the lower extremity.  - No cystic structure found in the popliteal fossa.  LEFT: - No evidence of common femoral vein obstruction.   *See table(s) above for measurements and observations.    Preliminary    DG Chest 2 View  Result Date: 04/28/2023 CLINICAL DATA:  Shortness of breath. EXAM: CHEST - 2 VIEW COMPARISON:  August 10, 2007. FINDINGS: The heart size and mediastinal contours are within normal limits. Minimal bibasilar subsegmental atelectasis or scarring is noted. Left-sided pacemaker is noted. The visualized skeletal structures are unremarkable. IMPRESSION: Minimal bibasilar subsegmental atelectasis or scarring. Electronically Signed   By: Lupita Raider M.D.   On: 04/28/2023 13:01   CUP PACEART INCLINIC DEVICE CHECK  Result Date: 04/28/2023 Patient in today for wound/device check appt 14 days post op gen PPM gen change on 04/15/2023.  Saw PCP yesterday with noted fluid retention, CXR shows evidence of fluid on lungs, and new dx of CHF.  He does not have a  gen card at present.  Today VS:  105/62, HR 62 (Afib/Vp rhythm- device dependent).  PULSE OX 84-89% at rest.  Daughter states she has observed increased wheezing and patient notes ongoing SOB on exertion. He has significant BLE, neck vein distention and concerns for fluid on lungs with auscultation. Reviewed with Dr. Graciela Husbands.  He recommends given level of fluid retention and progression over past 24 hours, patient should go to ER for diuresing and connection for HF management.  Device wound site assessed. Steri-strips removed. Wound without redness or edema. Incision edges approximated, wound well healed.  There is a small amount of swelling present in pocket, Dr. Graciela Husbands DOD today reviewed.  No changes, patient/daughter is to continue to monitor for any increase in size or s/s of infection. Normal device function. Thresholds, sensing, and impedances consistent with implant measurements. Device programmed appropriately for chronic leads. Histogram distribution appropriate for patient and level of activity. Programmed VVI.  1 NSVT 9 bests.  Patient does not have any restrictions with arm movement or lifting.  Daughter is taking patient over via car to [facility] ER immediately.  Report by Dr. Odessa Fleming nurse has been called to the triage team in the ER awaiting arrival.Mandy Ardelle Anton, RN   Pending Labs Unresulted Labs (From admission, onward)    None       Vitals/Pain Today's Vitals   04/28/23 1110 04/28/23 1135 04/28/23 1230 04/28/23 1415  BP: 128/73  (!) 114/90   Pulse: 60  (!) 59 60  Resp: 13  16 11   Temp: 97.6 F (36.4 C)     TempSrc: Oral     SpO2: 96%  98% 95%  PainSc:  0-No pain      Isolation Precautions No active isolations  Medications Medications  furosemide (LASIX) injection 20 mg (20 mg Intravenous Given 04/28/23 1446)    Mobility ambulatory     Focused Assessments    R Recommendations: See Admitting Provider Note  Report given to:   Additional Notes:

## 2023-04-28 NOTE — ED Provider Notes (Signed)
[place] EMERGENCY DEPARTMENT AT [facility] Provider Note   CSN: [id number] Arrival date & time: 04/28/23  1057     History  Chief Complaint  Patient presents with   CHF Exacerbation    ZAYDON DOETSCH is a 89 y.o. male.  HPI 89 year old male presents from the cardiology clinic with hypoxia and CHF.  Patient's history is primarily from the daughter at the bedside.  About 2 weeks ago he had a pacemaker procedure to change the generator.  Over the past few days he has developed leg swelling.  He was complaining of right calf pain a couple days ago.  He is on Eliquis which she temporarily stopped for the procedure but has restarted a week and a half ago.  He has shortness of breath when he walks but not at rest.  He has never been diagnosed with CHF but currently is having bilateral leg swelling and his sats were in the 80s at his PCP yesterday and then again in the 80s at the cardiology office.  There was also swelling around his pacemaker site.  Patient denies any chest pain.  He does not have a prior known history of CHF.  He was started on Lasix and took his first dose last night by his PCP.  Daughter is also concerned because on a couple different instances doctors have told them that he has had blood in the urine though no gross blood has been seen since this procedure a couple weeks ago.  Home Medications Prior to Admission medications   Medication Sig Start Date End Date Taking? Authorizing Provider  acetaminophen (TYLENOL) 500 MG tablet Take 500-1,000 mg by mouth every 6 (six) hours as needed for moderate pain (pain score 4-6).   Yes [provider]  apixaban (ELIQUIS) 5 MG TABS tablet Take 1 tablet (5 mg total) by mouth 2 (two) times daily. 04/01/23  Yes Graciella Freer, PA-C  Ascorbic Acid (VITAMIN C PO) Take 1 tablet by mouth daily.   Yes [provider]  cholecalciferol (VITAMIN D) 1000 UNITS tablet Take 1,000 Units by mouth daily.   Yes  [provider]  Cyanocobalamin (B-12 PO) Take 1 tablet by mouth daily.   Yes [provider]  ferrous sulfate 325 (65 FE) MG tablet Take 325 mg by mouth daily with breakfast.   Yes [provider]  furosemide (LASIX) 20 MG tablet Take 20 mg by mouth every other day. 04/27/23  Yes [provider]  potassium chloride (KLOR-CON) 10 MEQ tablet Take 10 mEq by mouth every other day. 04/27/23  Yes [provider]  traMADol (ULTRAM) 50 MG tablet Take 50 mg by mouth every 8 (eight) hours as needed for moderate pain (pain score 4-6). 03/04/20  Yes [provider]  zolpidem (AMBIEN) 5 MG tablet Take 5 mg by mouth at bedtime.   Yes [provider]      Allergies    Patient has no known allergies.    Review of Systems   Review of Systems  Respiratory:  Positive for shortness of breath.   Cardiovascular:  Positive for leg swelling. Negative for chest pain.  Gastrointestinal:  Negative for abdominal distention.    Physical Exam Updated Vital Signs BP (!) 114/90   Pulse 60   Temp 97.6 F (36.4 C) (Oral)   Resp 11   SpO2 95%  Physical Exam Vitals and nursing note reviewed.  Constitutional:      General: He is not in acute  distress.    Appearance: He is well-developed. He is not ill-appearing or diaphoretic.  HENT:     Head: Normocephalic and atraumatic.  Cardiovascular:     Rate and Rhythm: Normal rate and regular rhythm.     Heart sounds: Normal heart sounds.  Pulmonary:     Effort: Pulmonary effort is normal.     Comments: Mild rales at bases Chest:    Abdominal:     General: There is no distension.     Palpations: Abdomen is soft.     Tenderness: There is no abdominal tenderness.  Musculoskeletal:     Right lower leg: Edema present.     Left lower leg: Edema present.     Comments: There is pitting edema in the bilateral lower legs. No calf tenderness or homans sign in either leg  Skin:    General: Skin is warm and  dry.  Neurological:     Mental Status: He is alert.     ED Results / Procedures / Treatments   Labs (all labs ordered are listed, but only abnormal results are displayed) Labs Reviewed  COMPREHENSIVE METABOLIC PANEL - Abnormal; Notable for the following components:      Result Value   BUN 29 (*)    Creatinine, Ser 1.28 (*)    Calcium 8.6 (*)    Albumin 2.8 (*)    Total Bilirubin 1.3 (*)    GFR, Estimated 51 (*)    All other components within normal limits  BRAIN NATRIURETIC PEPTIDE - Abnormal; Notable for the following components:   B Natriuretic Peptide 353.2 (*)    All other components within normal limits  CBC WITH DIFFERENTIAL/PLATELET - Abnormal; Notable for the following components:   RBC 3.45 (*)    Hemoglobin 11.2 (*)    HCT 34.0 (*)    All other components within normal limits  URINALYSIS, ROUTINE W REFLEX MICROSCOPIC  TROPONIN I (HIGH SENSITIVITY)  TROPONIN I (HIGH SENSITIVITY)    EKG EKG Interpretation Date/Time:  Wednesday April 28 2023 11:04:41 EST Ventricular Rate:  63 PR Interval:    QRS Duration:  148 QT Interval:  429 QTC Calculation: 440 R Axis:   220  Text Interpretation: Junctional rhythm Nonspecific intraventricular conduction delay Confirmed by Pricilla Loveless [phone]) on 04/28/2023 3:39:55 PM  Radiology VAS [place] LOWER EXTREMITY VENOUS (DVT) (7a-7p)  Result Date: 04/28/2023  Lower Venous DVT Study Patient Name:  JOMEL PAPWORTH  Date of Exam:   04/28/2023 Medical Rec #: [id number]         Accession #:    [id number] Date of Birth: Mar 05, 1925          Patient Gender: M Patient Age:   24 years Exam Location:  [facility] Procedure:      VAS [place] LOWER EXTREMITY VENOUS (DVT) Referring Phys: Lorin Picket Wilbur Labuda --------------------------------------------------------------------------------  Indications: Swelling, right calf, and Edema.  Comparison Study: No priors. Performing Technologist: Marilynne Halsted RDMS, RVT  Examination Guidelines: A complete  evaluation includes B-mode imaging, spectral Doppler, color Doppler, and power Doppler as needed of all accessible portions of each vessel. Bilateral testing is considered an integral part of a complete examination. Limited examinations for reoccurring indications may be performed as noted. The reflux portion of the exam is performed with the patient in reverse Trendelenburg.  +---------+---------------+---------+-----------+----------+--------------+ RIGHT    CompressibilityPhasicitySpontaneityPropertiesThrombus Aging +---------+---------------+---------+-----------+----------+--------------+ CFV      Full           Yes      Yes                                 +---------+---------------+---------+-----------+----------+--------------+  SFJ      Full                                                        +---------+---------------+---------+-----------+----------+--------------+ FV Prox  Full                                                        +---------+---------------+---------+-----------+----------+--------------+ FV Mid   Full                                                        +---------+---------------+---------+-----------+----------+--------------+ FV DistalFull                                                        +---------+---------------+---------+-----------+----------+--------------+ PFV      Full                                                        +---------+---------------+---------+-----------+----------+--------------+ POP      Full           Yes      Yes                                 +---------+---------------+---------+-----------+----------+--------------+ PTV      Full                                                        +---------+---------------+---------+-----------+----------+--------------+ PERO     Full                                                         +---------+---------------+---------+-----------+----------+--------------+   +----+---------------+---------+-----------+----------+--------------+ LEFTCompressibilityPhasicitySpontaneityPropertiesThrombus Aging +----+---------------+---------+-----------+----------+--------------+ CFV Full           Yes      Yes                                 +----+---------------+---------+-----------+----------+--------------+     Summary: RIGHT: - There is no evidence of deep vein thrombosis in the lower extremity.  - No cystic structure found in the popliteal fossa.  LEFT: - No evidence of common femoral vein obstruction.   *See table(s) above for measurements and observations.    Preliminary  DG Chest 2 View  Result Date: 04/28/2023 CLINICAL DATA:  Shortness of breath. EXAM: CHEST - 2 VIEW COMPARISON:  August 10, 2007. FINDINGS: The heart size and mediastinal contours are within normal limits. Minimal bibasilar subsegmental atelectasis or scarring is noted. Left-sided pacemaker is noted. The visualized skeletal structures are unremarkable. IMPRESSION: Minimal bibasilar subsegmental atelectasis or scarring. Electronically Signed   By: Lupita Raider M.D.   On: 04/28/2023 13:01   CUP PACEART INCLINIC DEVICE CHECK  Result Date: 04/28/2023 Patient in today for wound/device check appt 14 days post op gen PPM gen change on 04/15/2023.  Saw PCP yesterday with noted fluid retention, CXR shows evidence of fluid on lungs, and new dx of CHF.  He does not have a gen card at present.  Today VS:  105/62, HR 62 (Afib/Vp rhythm- device dependent).  PULSE OX 84-89% at rest.  Daughter states she has observed increased wheezing and patient notes ongoing SOB on exertion. He has significant BLE, neck vein distention and concerns for fluid on lungs with auscultation. Reviewed with Dr. Graciela Husbands.  He recommends given level of fluid retention and progression over past 24 hours, patient should go to ER for diuresing and  connection for HF management.  Device wound site assessed. Steri-strips removed. Wound without redness or edema. Incision edges approximated, wound well healed.  There is a small amount of swelling present in pocket, Dr. Graciela Husbands DOD today reviewed.  No changes, patient/daughter is to continue to monitor for any increase in size or s/s of infection. Normal device function. Thresholds, sensing, and impedances consistent with implant measurements. Device programmed appropriately for chronic leads. Histogram distribution appropriate for patient and level of activity. Programmed VVI.  1 NSVT 9 bests.  Patient does not have any restrictions with arm movement or lifting.  Daughter is taking patient over via car to [facility] ER immediately.  Report by Dr. Odessa Fleming nurse has been called to the triage team in the ER awaiting arrival.Mandy Ardelle Anton, RN   Procedures Procedures    Medications Ordered in ED Medications  traMADol (ULTRAM) tablet 50 mg (has no administration in time range)  apixaban (ELIQUIS) tablet 2.5 mg (has no administration in time range)  zolpidem (AMBIEN) tablet 5 mg (has no administration in time range)  sodium chloride flush (NS) 0.9 % injection 3 mL (has no administration in time range)  sodium chloride flush (NS) 0.9 % injection 3 mL (has no administration in time range)  0.9 %  sodium chloride infusion (has no administration in time range)  acetaminophen (TYLENOL) tablet 650 mg (has no administration in time range)  ondansetron (ZOFRAN) injection 4 mg (has no administration in time range)  furosemide (LASIX) injection 20 mg (has no administration in time range)  furosemide (LASIX) injection 20 mg (20 mg Intravenous Given 04/28/23 1446)    ED Course/ Medical Decision Making/ A&P                                Medical Decision Making Amount and/or Complexity of Data Reviewed Labs: ordered.    Details: Elevated BNP. Radiology: ordered and independent interpretation performed.     Details: No pulmonary edema. ECG/medicine tests: ordered and independent interpretation performed.    Details: No acute ischemia, similar to baseline  Risk Prescription drug management. Decision regarding hospitalization.   Patient presents with what appears to be new onset CHF.  He is not distressed but does have symptoms concerning  for exertional dyspnea and he has a little JVD on exam.  He took some Lasix at home but given he still symptomatic I think it makes more sense to admit him for some IV diuresis and workup with an echo, which she has not had in many years and does not carry a CHF diagnosis.  No chest pain or anginal symptoms.  Does not have a DVT though he did have some asymmetric leg pain though at this point with negative DVT study and him being compliant with Eliquis I think PE is pretty unlikely.  Discussed with Dr. Katrinka Blazing for admission.        Final Clinical Impression(s) / ED Diagnoses Final diagnoses:  Acute congestive heart failure, unspecified heart failure type [facility])    Rx / DC Orders ED Discharge Orders     None         Pricilla Loveless, MD 04/28/23 1540

## 2023-04-28 NOTE — Progress Notes (Signed)
Right lower ext venous duplex  has been completed. Refer to [facility] under chart review to view preliminary results.   04/28/2023  1:30 PM Lise Pincus, Gerarda Gunther  .

## 2023-04-28 NOTE — Consult Note (Addendum)
Cardiology Consultation   Patient ID: [name] MRN: [id number]; DOB: September 07, 1924  Admit date: 04/28/2023 Date of Consult: 04/28/2023  PCP:  Rodrigo Ran, MD   [facility] Providers Cardiologist:  None  Electrophysiologist:  Maurice Small, MD       Patient Profile:   [name] is a 89 y.o. male with a hx of symptomatic bradycardia s/p PPM with recent GEN change, longstanding persistent atrial fibrillation on Eliquis who is being seen 04/28/2023 for the evaluation of CHF at the request of Dr. Katrinka Blazing.  History of Present Illness:   [name] with the above past medical history underwent a generator change for his pacemaker on 04/15/2023 with Dr. Nelly Laurence.  He saw his PCP yesterday with shortness of breath and lower extremity swelling.  Chest x-Maurice concerning for changes consistent with CHF.  He has no history of echocardiogram.  He does not carry a history of systolic heart failure.  He was seen in the device clinic today for scheduled wound/device check.  He was hypoxic at 84-89% on room air.  He was observed to have wheezing, shortness of breath, and bilateral lower extremity swelling.  Case reviewed with Dr. Graciela Husbands who recommended ER evaluation.  On arrival, patient was bradycardic 59-60 with BP 128/73 and 96% on room air.   BNP 353 sCr 1.28 Albumin 2.8 Hb 11.2  DVT ruled out with US  Echo pending.  During my interview, his daughter is bedside and helps with history.  The patient describes a baseline stable amount of shortness of breath.  He lives at a facility ([facility]) and walks the hall using a walker.  He denies increasing dyspnea on exertion.  He has never had chest pain.  He states that yesterday when he woke up he felt a significant right calf pain that felt like a muscle cramp that would not go away.  PCP felt he was volume up and started 20 mg Lasix which she took yesterday.  He states he was up urinating all night.  He does not report  significant increased SOB.    Past Medical History:  Diagnosis Date   Macular degeneration    Osteoporosis    Sick sinus syndrome [facility])    a. s/p MDT dual chamber PPM implant 11/2014    Past Surgical History:  Procedure Laterality Date   PACEMAKER INSERTION  11/2014   MDT dual chamber PPM implanted for SSS    PPM GENERATOR CHANGEOUT N/A 04/15/2023   Procedure: PPM GENERATOR CHANGEOUT;  Surgeon: Mealor, Roberts Gaudy, MD;  Location: [facility] INVASIVE CV LAB;  Service: Cardiovascular;  Laterality: N/A;     Home Medications:  Prior to Admission medications   Medication Sig Start Date End Date Taking? Authorizing Provider  acetaminophen (TYLENOL) 500 MG tablet Take 500-1,000 mg by mouth every 6 (six) hours as needed for moderate pain (pain score 4-6).   Yes [provider]  apixaban (ELIQUIS) 5 MG TABS tablet Take 1 tablet (5 mg total) by mouth 2 (two) times daily. 04/01/23  Yes Graciella Freer, PA-C  Ascorbic Acid (VITAMIN C PO) Take 1 tablet by mouth daily.   Yes [provider]  cholecalciferol (VITAMIN D) 1000 UNITS tablet Take 1,000 Units by mouth daily.   Yes [provider]  Cyanocobalamin (B-12 PO) Take 1 tablet by mouth daily.   Yes [provider]  ferrous sulfate 325 (65 FE) MG tablet Take 325 mg by mouth daily with breakfast.   Yes [provider]  furosemide (LASIX) 20 MG tablet Take 20 mg by mouth every other day. 04/27/23  Yes [provider]  potassium chloride (KLOR-CON) 10 MEQ tablet Take 10 mEq by mouth every other day. 04/27/23  Yes [provider]  traMADol (ULTRAM) 50 MG tablet Take 50 mg by mouth every 8 (eight) hours as needed for moderate pain (pain score 4-6). 03/04/20  Yes [provider]  zolpidem (AMBIEN) 5 MG tablet Take 5 mg by mouth at bedtime.   Yes [provider]    Inpatient Medications: Scheduled Meds:  apixaban  2.5 mg Oral BID   [START ON 04/29/2023] furosemide  20 mg  Intravenous BID   sodium chloride flush  3 mL Intravenous Q12H   zolpidem  5 mg Oral QHS   Continuous Infusions:  sodium chloride     PRN Meds: sodium chloride, acetaminophen, ondansetron (ZOFRAN) IV, sodium chloride flush, traMADol  Allergies:   No Known Allergies  Social History:   Social History   Socioeconomic History   Marital status: Widowed    Spouse name: Not on file   Number of children: Not on file   Years of education: Not on file   Highest education level: Not on file  Occupational History   Not on file  Tobacco Use   Smoking status: Never   Smokeless tobacco: Never  Substance and Sexual Activity   Alcohol use: Not on file   Drug use: Not on file   Sexual activity: Not on file  Other Topics Concern   Not on file  Social History Narrative   Not on file   Social Determinants of Health   Financial Resource Strain: Not on file  Food Insecurity: Not on file  Transportation Needs: Not on file  Physical Activity: Not on file  Stress: Not on file  Social Connections: Not on file  Intimate Partner Violence: Not on file    Family History:    Family History  Problem Relation Age of Onset   Other Mother        OLD AGE   Stroke Father    Heart attack Brother    Cancer Brother    Cancer Brother    Healthy Son    Healthy Daughter      ROS:  Please see the history of present illness.   All other ROS reviewed and negative.     Physical Exam/Data:   Vitals:   04/28/23 1110 04/28/23 1230 04/28/23 1415 04/28/23 1545  BP: 128/73 (!) 114/90  129/70  Pulse: 60 (!) 59 60 (!) 59  Resp: 13 16 11 16   Temp: 97.6 F (36.4 C)   98 F (36.7 C)  TempSrc: Oral     SpO2: 96% 98% 95% 97%   No intake or output data in the 24 hours ending 04/28/23 1606    04/15/2023    6:19 AM 04/01/2023   10:20 AM 04/06/2022   10:43 AM  Last 3 Weights  Weight (lbs) 168 lb 170 lb 164 lb 6.4 oz  Weight (kg) 76.204 kg 77.111 kg 74.571 kg     There is no height or weight on  file to calculate BMI.  General:  elderly male in NAD HEENT: normal Neck: + JVD Vascular: No carotid bruits; Distal pulses 2+ bilaterally Cardiac:  regular rhythm, regular rate, no murmur Lungs:  clear in upper lobes, diminished in bases  Abd: soft, nontender, no hepatomegaly  Ext: 3+ B LE edema Musculoskeletal:  No deformities, BUE  and BLE strength normal and equal Skin: warm and dry  Neuro:  CNs 2-12 intact, no focal abnormalities noted Psych:  Normal affect   EKG:  The EKG was personally reviewed and demonstrates:  appears junctional rhythm in the 60s Telemetry:  Telemetry was personally reviewed and demonstrates: junctional rhythm, I do not see p waves or pacer spikes  Relevant CV Studies:  Echo pending   Laboratory Data:  High Sensitivity Troponin:   Recent Labs  Lab 04/28/23 1250  TROPONINIHS 13     Chemistry Recent Labs  Lab 04/28/23 1250  NA 137  K 4.0  CL 106  CO2 25  GLUCOSE 92  BUN 29*  CREATININE 1.28*  CALCIUM 8.6*  GFRNONAA 51*  ANIONGAP 6    Recent Labs  Lab 04/28/23 1250  PROT 6.6  ALBUMIN 2.8*  AST 25  ALT 15  ALKPHOS 56  BILITOT 1.3*   Lipids No results for input(s): "CHOL", "TRIG", "HDL", "LABVLDL", "LDLCALC", "CHOLHDL" in the last 168 hours.  Hematology Recent Labs  Lab 04/28/23 1250  WBC 7.5  RBC 3.45*  HGB 11.2*  HCT 34.0*  MCV 98.6  MCH 32.5  MCHC 32.9  RDW 14.8  PLT 197   Thyroid No results for input(s): "TSH", "FREET4" in the last 168 hours.  BNP Recent Labs  Lab 04/28/23 1250  BNP 353.2*    DDimer No results for input(s): "DDIMER" in the last 168 hours.   Radiology/Studies:  VAS [place] LOWER EXTREMITY VENOUS (DVT) (7a-7p)  Result Date: 04/28/2023  Lower Venous DVT Study Patient Name:  [name]  Date of Exam:   04/28/2023 Medical Rec #: [id number]         Accession #:    [id number] Date of Birth: [birth year]          Patient Gender: M Patient Age:   19 years Exam Location:  [facility] Procedure:       VAS [place] LOWER EXTREMITY VENOUS (DVT) Referring Phys: Lorin Picket [place] --------------------------------------------------------------------------------  Indications: Swelling, right calf, and Edema.  Comparison Study: No priors. Performing Technologist: Marilynne Halsted RDMS, RVT  Examination Guidelines: A complete evaluation includes B-mode imaging, spectral Doppler, color Doppler, and power Doppler as needed of all accessible portions of each vessel. Bilateral testing is considered an integral part of a complete examination. Limited examinations for reoccurring indications may be performed as noted. The reflux portion of the exam is performed with the patient in reverse Trendelenburg.  +---------+---------------+---------+-----------+----------+--------------+ RIGHT    CompressibilityPhasicitySpontaneityPropertiesThrombus Aging +---------+---------------+---------+-----------+----------+--------------+ CFV      Full           Yes      Yes                                 +---------+---------------+---------+-----------+----------+--------------+ SFJ      Full                                                        +---------+---------------+---------+-----------+----------+--------------+ FV Prox  Full                                                        +---------+---------------+---------+-----------+----------+--------------+  FV Mid   Full                                                        +---------+---------------+---------+-----------+----------+--------------+ FV DistalFull                                                        +---------+---------------+---------+-----------+----------+--------------+ PFV      Full                                                        +---------+---------------+---------+-----------+----------+--------------+ POP      Full           Yes      Yes                                  +---------+---------------+---------+-----------+----------+--------------+ PTV      Full                                                        +---------+---------------+---------+-----------+----------+--------------+ PERO     Full                                                        +---------+---------------+---------+-----------+----------+--------------+   +----+---------------+---------+-----------+----------+--------------+ LEFTCompressibilityPhasicitySpontaneityPropertiesThrombus Aging +----+---------------+---------+-----------+----------+--------------+ CFV Full           Yes      Yes                                 +----+---------------+---------+-----------+----------+--------------+     Summary: RIGHT: - There is no evidence of deep vein thrombosis in the lower extremity.  - No cystic structure found in the popliteal fossa.  LEFT: - No evidence of common femoral vein obstruction.   *See table(s) above for measurements and observations.    Preliminary    DG Chest 2 View  Result Date: 04/28/2023 CLINICAL DATA:  Shortness of breath. EXAM: CHEST - 2 VIEW COMPARISON:  August 10, 2007. FINDINGS: The heart size and mediastinal contours are within normal limits. Minimal bibasilar subsegmental atelectasis or scarring is noted. Left-sided pacemaker is noted. The visualized skeletal structures are unremarkable. IMPRESSION: Minimal bibasilar subsegmental atelectasis or scarring. Electronically Signed   By: Lupita Raider M.D.   On: 04/28/2023 13:01   CUP PACEART INCLINIC DEVICE CHECK  Result Date: 04/28/2023 Patient in today for wound/device check appt 14 days post op gen PPM gen change on 04/15/2023.  Saw PCP yesterday with noted fluid retention, CXR shows evidence of fluid on lungs, and new dx of  CHF.  He does not have a gen card at present.  Today VS:  105/62, HR 62 (Afib/Vp rhythm- device dependent).  PULSE OX 84-89% at rest.  Daughter states she has observed increased  wheezing and patient notes ongoing SOB on exertion. He has significant BLE, neck vein distention and concerns for fluid on lungs with auscultation. Reviewed with Dr. Graciela Husbands.  He recommends given level of fluid retention and progression over past 24 hours, patient should go to ER for diuresing and connection for HF management.  Device wound site assessed. Steri-strips removed. Wound without redness or edema. Incision edges approximated, wound well healed.  There is a small amount of swelling present in pocket, Dr. Graciela Husbands DOD today reviewed.  No changes, patient/daughter is to continue to monitor for any increase in size or s/s of infection. Normal device function. Thresholds, sensing, and impedances consistent with implant measurements. Device programmed appropriately for chronic leads. Histogram distribution appropriate for patient and level of activity. Programmed VVI.  1 NSVT 9 bests.  Patient does not have any restrictions with arm movement or lifting.  Daughter is taking patient over via car to [facility] ER immediately.  Report by Dr. Odessa Fleming nurse has been called to the triage team in the ER awaiting arrival.Mandy Ardelle Anton, RN    Assessment and Plan:   Hypoxia  Lower extremity swelling - BNP 353, CXR with minimal bibasilar subsegmental atelectasis - took PO 20 mg lasix yesterday and urinated all night - has received 40 mg IV lasix this afternoon with brisk diuresis while I was in the room - will hold off on additional lasix in this gentleman who is essentially lasix naive - echo pending, agree, hopefully can get this tomorrow morning - increase protein intake as tolerated   Permanent atrial fibrillation Chronic anticoagulation CHB PPM - dual chamber in place - recent generator change 04/15/23 - device check today with normal device function, device programmed appropriately - continue eliquis   Risk Assessment/Risk Scores:       New York Heart Association (NYHA) Functional Class NYHA  Class IV  CHA2DS2-VASc Score = 2   This indicates a 2.2% annual risk of stroke. The patient's score is based upon: CHF History: 0 HTN History: 0 Diabetes History: 0 Stroke History: 0 Vascular Disease History: 0 Age Score: 2 Gender Score: 0    For questions or updates, please contact [facility] Please consult [email or web address] for contact info under   Signed, Marcelino Duster, PA  04/28/2023 4:06 PM As above, patient seen and examined.  Briefly he is a 89 year old male with past medical history of pacemaker secondary to symptomatic bradycardia, longstanding atrial fibrillation for evaluation of congestive heart failure.  No prior echocardiogram available for review.  Patient resides in assisted living.  Patient was noted to have pain in his right knee yesterday.  He also had increased lower extremity edema and a chest x-Maurice apparently showed CHF.  He was seen in device clinic today and was noted to be hypoxic with saturations in the 84 to 89% range.  He was therefore sent to the emergency room and cardiology asked to evaluate.  Patient does have dyspnea with activities which is longstanding and unchanged by his report.  No orthopnea or PND.  No chest pain.  He has occasional ankle edema but now has bilateral lower extremity edema that is worse.  2+ lower extremity edema on examination.  Electrocardiogram shows ventricular pacing.  BUN 29 and creatinine 1.28.  Albumin 2.8.  BNP  353.  Troponin 13 and 12.  Hemoglobin 11.2.  Lower extremity venous Doppler showed no DVT on the right.  1 acute diastolic congestive heart failure-patient denies increased dyspnea (has chronic dyspnea on exertion).  He does have 2+ lower extremity edema.  Apparently was hypoxic in the office.  He received 40 mg of Lasix and is having excellent urine output from this.  Will reassess tomorrow morning to see if additional diuresis is needed.  Await results of echocardiogram performed today.  2 status post recent  pacemaker generator change  3 permanent atrial fibrillation-continue apixaban.  Note he is on 2.5 mg twice daily and ideally should be on 5 mg twice daily if his creatinine continues below 1.5.  Will follow renal function with diuresis and adjust apixaban based on results of creatinine.  Olga Millers, MD

## 2023-04-28 NOTE — ED Notes (Signed)
ED TO INPATIENT HANDOFF REPORT  ED Nurse Name and Phone #:  Ave Filter [phone]  S Name/Age/Gender [name] 89 y.o. male Room/Bed: TRACC/TRACC  Code Status   Code Status: Full Code  Home/SNF/Other Home Patient oriented to: self, place, time, and situation Is this baseline? Yes   Triage Complete: Triage complete  Chief Complaint CHF (congestive heart failure) (HCC) [I50.9]  Triage Note Pt BIB daughter from doctors appointment for increased leg swelling & decreased O2sats. Pt has a hx of Heart Failure.V/S stable upon arrival.   Allergies No Known Allergies  Level of Care/Admitting Diagnosis ED Disposition     ED Disposition  Admit   Condition  --   Comment  Hospital Area: [facility] [[id number]]  Level of Care: Telemetry Cardiac [[id number]]  May admit patient to [facility] or [facility] if equivalent level of care is available:: No  Covid Evaluation: Asymptomatic - no recent exposure (last 10 days) testing not required  Diagnosis: CHF (congestive heart failure) [facility]) [[id number]]  Admitting Physician: Clydie Braun [[id number]]  Attending Physician: Clydie Braun [[id number]]  Certification:: I certify this patient will need inpatient services for at least 2 midnights  Expected Medical Readiness: 04/30/2023          B Medical/Surgery History Past Medical History:  Diagnosis Date   Macular degeneration    Osteoporosis    Sick sinus syndrome (HCC)    a. s/p MDT dual chamber PPM implant 11/2014   Past Surgical History:  Procedure Laterality Date   PACEMAKER INSERTION  11/2014   MDT dual chamber PPM implanted for SSS    PPM GENERATOR CHANGEOUT N/A 04/15/2023   Procedure: PPM GENERATOR CHANGEOUT;  Surgeon: Maurice Small, MD;  Location: [facility] INVASIVE CV LAB;  Service: Cardiovascular;  Laterality: N/A;     A IV Location/Drains/Wounds Patient Lines/Drains/Airways Status     Active Line/Drains/Airways     Name Placement date Placement time  Site Days   Peripheral IV 04/28/23 18 G Anterior;Distal;Right;Upper Arm 04/28/23  1258  Arm  less than 1            Intake/Output Last 24 hours No intake or output data in the 24 hours ending 04/28/23 1618  Labs/Imaging Results for orders placed or performed during the hospital encounter of 04/28/23 (from the past 48 hour(s))  Comprehensive metabolic panel     Status: Abnormal   Collection Time: 04/28/23 12:50 PM  Result Value Ref Range   Sodium 137 135 - 145 mmol/L   Potassium 4.0 3.5 - 5.1 mmol/L   Chloride 106 98 - 111 mmol/L   CO2 25 22 - 32 mmol/L   Glucose, Bld 92 70 - 99 mg/dL    Comment: Glucose reference range applies only to samples taken after fasting for at least 8 hours.   BUN 29 (H) 8 - 23 mg/dL   Creatinine, Ser 3.87 (H) 0.61 - 1.24 mg/dL   Calcium 8.6 (L) 8.9 - 10.3 mg/dL   Total Protein 6.6 6.5 - 8.1 g/dL   Albumin 2.8 (L) 3.5 - 5.0 g/dL   AST 25 15 - 41 U/L   ALT 15 0 - 44 U/L   Alkaline Phosphatase 56 38 - 126 U/L   Total Bilirubin 1.3 (H) <1.2 mg/dL   GFR, Estimated 51 (L) >60 mL/min    Comment: (NOTE) Calculated using the CKD-EPI Creatinine Equation (2021)    Anion gap 6 5 - 15    Comment: Performed at [facility]  Lab, [address] [place], [place] [address]  Troponin I (High Sensitivity)     Status: None   Collection Time: 04/28/23 12:50 PM  Result Value Ref Range   Troponin I (High Sensitivity) 13 <18 ng/L    Comment: (NOTE) Elevated high sensitivity troponin I (hsTnI) values and significant  changes across serial measurements may suggest ACS but many other  chronic and acute conditions are known to elevate hsTnI results.  Refer to the "Links" section for chest pain algorithms and additional  guidance. Performed at [facility], [address] [place], [place] [address]   Brain natriuretic peptide     Status: Abnormal   Collection Time: 04/28/23 12:50 PM  Result Value Ref Range   B Natriuretic Peptide 353.2 (H) 0.0 - 100.0  pg/mL    Comment: Performed at [facility], [address] [place], [place] [address]  CBC with Differential     Status: Abnormal   Collection Time: 04/28/23 12:50 PM  Result Value Ref Range   WBC 7.5 4.0 - 10.5 K/uL   RBC 3.45 (L) 4.22 - 5.81 MIL/uL   Hemoglobin 11.2 (L) 13.0 - 17.0 g/dL   HCT 21.3 (L) 08.6 - 57.8 %   MCV 98.6 80.0 - 100.0 fL   MCH 32.5 26.0 - 34.0 pg   MCHC 32.9 30.0 - 36.0 g/dL   RDW 46.9 62.9 - 52.8 %   Platelets 197 150 - 400 K/uL   nRBC 0.0 0.0 - 0.2 %   Neutrophils Relative % 58 %   Neutro Abs 4.4 1.7 - 7.7 K/uL   Lymphocytes Relative 25 %   Lymphs Abs 1.9 0.7 - 4.0 K/uL   Monocytes Relative 13 %   Monocytes Absolute 1.0 0.1 - 1.0 K/uL   Eosinophils Relative 3 %   Eosinophils Absolute 0.2 0.0 - 0.5 K/uL   Basophils Relative 1 %   Basophils Absolute 0.1 0.0 - 0.1 K/uL   Immature Granulocytes 0 %   Abs Immature Granulocytes 0.02 0.00 - 0.07 K/uL    Comment: Performed at [facility] Lab, [address] [place], [place] [address]  Urinalysis, Routine w reflex microscopic -Urine, Clean Catch     Status: None   Collection Time: 04/28/23  2:24 PM  Result Value Ref Range   Color, Urine YELLOW YELLOW   APPearance CLEAR CLEAR   Specific Gravity, Urine 1.014 1.005 - 1.030   pH 5.0 5.0 - 8.0   Glucose, UA NEGATIVE NEGATIVE mg/dL   Hgb urine dipstick NEGATIVE NEGATIVE   Bilirubin Urine NEGATIVE NEGATIVE   Ketones, ur NEGATIVE NEGATIVE mg/dL   Protein, ur NEGATIVE NEGATIVE mg/dL   Nitrite NEGATIVE NEGATIVE   Leukocytes,Ua NEGATIVE NEGATIVE   RBC / HPF 11-20 0 - 5 RBC/hpf   WBC, UA 0-5 0 - 5 WBC/hpf   Bacteria, UA NONE SEEN NONE SEEN   Squamous Epithelial / HPF 0-5 0 - 5 /HPF    Comment: Performed at [facility] Lab, [address], [place], [place] [address]  Troponin I (High Sensitivity)     Status: None   Collection Time: 04/28/23  2:37 PM  Result Value Ref Range   Troponin I (High Sensitivity) 12 <18 ng/L    Comment: (NOTE) Elevated high  sensitivity troponin I (hsTnI) values and significant  changes across serial measurements may suggest ACS but many other  chronic and acute conditions are known to elevate hsTnI results.  Refer to the "Links" section for  chest pain algorithms and additional  guidance. Performed at [facility] Lab, [address], [place], [place] [address]    VAS [place] LOWER EXTREMITY VENOUS (DVT) (7a-7p)  Result Date: 04/28/2023  Lower Venous DVT Study Patient Name:  [name]  Date of Exam:   04/28/2023 Medical Rec #: [id number]         Accession #:    [id number] Date of Birth: [birth year]          Patient Gender: M Patient Age:   89 years Exam Location:  [facility] Procedure:      VAS [place] LOWER EXTREMITY VENOUS (DVT) Referring Phys: Lorin Picket [place] --------------------------------------------------------------------------------  Indications: Swelling, right calf, and Edema.  Comparison Study: No priors. Performing Technologist: Marilynne Halsted RDMS, RVT  Examination Guidelines: A complete evaluation includes B-mode imaging, spectral Doppler, color Doppler, and power Doppler as needed of all accessible portions of each vessel. Bilateral testing is considered an integral part of a complete examination. Limited examinations for reoccurring indications may be performed as noted. The reflux portion of the exam is performed with the patient in reverse Trendelenburg.  +---------+---------------+---------+-----------+----------+--------------+ RIGHT    CompressibilityPhasicitySpontaneityPropertiesThrombus Aging +---------+---------------+---------+-----------+----------+--------------+ CFV      Full           Yes      Yes                                 +---------+---------------+---------+-----------+----------+--------------+ SFJ      Full                                                        +---------+---------------+---------+-----------+----------+--------------+ FV Prox  Full                                                         +---------+---------------+---------+-----------+----------+--------------+ FV Mid   Full                                                        +---------+---------------+---------+-----------+----------+--------------+ FV DistalFull                                                        +---------+---------------+---------+-----------+----------+--------------+ PFV      Full                                                        +---------+---------------+---------+-----------+----------+--------------+ POP      Full           Yes      Yes                                 +---------+---------------+---------+-----------+----------+--------------+  PTV      Full                                                        +---------+---------------+---------+-----------+----------+--------------+ PERO     Full                                                        +---------+---------------+---------+-----------+----------+--------------+   +----+---------------+---------+-----------+----------+--------------+ LEFTCompressibilityPhasicitySpontaneityPropertiesThrombus Aging +----+---------------+---------+-----------+----------+--------------+ CFV Full           Yes      Yes                                 +----+---------------+---------+-----------+----------+--------------+     Summary: RIGHT: - There is no evidence of deep vein thrombosis in the lower extremity.  - No cystic structure found in the popliteal fossa.  LEFT: - No evidence of common femoral vein obstruction.   *See table(s) above for measurements and observations.    Preliminary    DG Chest 2 View  Result Date: 04/28/2023 CLINICAL DATA:  Shortness of breath. EXAM: CHEST - 2 VIEW COMPARISON:  August 10, 2007. FINDINGS: The heart size and mediastinal contours are within normal limits. Minimal bibasilar subsegmental atelectasis or scarring is noted.  Left-sided pacemaker is noted. The visualized skeletal structures are unremarkable. IMPRESSION: Minimal bibasilar subsegmental atelectasis or scarring. Electronically Signed   By: Lupita Raider M.D.   On: 04/28/2023 13:01   CUP PACEART INCLINIC DEVICE CHECK  Result Date: 04/28/2023 Patient in today for wound/device check appt 14 days post op gen PPM gen change on 04/15/2023.  Saw PCP yesterday with noted fluid retention, CXR shows evidence of fluid on lungs, and new dx of CHF.  He does not have a gen card at present.  Today VS:  105/62, HR 62 (Afib/Vp rhythm- device dependent).  PULSE OX 84-89% at rest.  Daughter states she has observed increased wheezing and patient notes ongoing SOB on exertion. He has significant BLE, neck vein distention and concerns for fluid on lungs with auscultation. Reviewed with Dr. Graciela Husbands.  He recommends given level of fluid retention and progression over past 24 hours, patient should go to ER for diuresing and connection for HF management.  Device wound site assessed. Steri-strips removed. Wound without redness or edema. Incision edges approximated, wound well healed.  There is a small amount of swelling present in pocket, Dr. Graciela Husbands DOD today reviewed.  No changes, patient/daughter is to continue to monitor for any increase in size or s/s of infection. Normal device function. Thresholds, sensing, and impedances consistent with implant measurements. Device programmed appropriately for chronic leads. Histogram distribution appropriate for patient and level of activity. Programmed VVI.  1 NSVT 9 bests.  Patient does not have any restrictions with arm movement or lifting.  Daughter is taking patient over via car to [facility] ER immediately.  Report by Dr. Odessa Fleming nurse has been called to the triage team in the ER awaiting arrival.Mandy Ardelle Anton, RN   Pending Labs [employer] (From admission, onward)     Start     Ordered  04/29/23 0500  Basic metabolic panel  Daily,   R      Comments: As Scheduled for 5 days    04/28/23 1539            Vitals/Pain Today's Vitals   04/28/23 1135 04/28/23 1230 04/28/23 1415 04/28/23 1545  BP:  (!) 114/90  129/70  Pulse:  (!) 59 60 (!) 59  Resp:  16 11 16   Temp:    98 F (36.7 C)  TempSrc:      SpO2:  98% 95% 97%  PainSc: 0-No pain       Isolation Precautions No active isolations  Medications Medications  traMADol (ULTRAM) tablet 50 mg (has no administration in time range)  apixaban (ELIQUIS) tablet 2.5 mg (has no administration in time range)  zolpidem (AMBIEN) tablet 5 mg (has no administration in time range)  sodium chloride flush (NS) 0.9 % injection 3 mL (3 mLs Intravenous Not Given 04/28/23 1555)  sodium chloride flush (NS) 0.9 % injection 3 mL (has no administration in time range)  0.9 %  sodium chloride infusion (has no administration in time range)  acetaminophen (TYLENOL) tablet 650 mg (has no administration in time range)  ondansetron (ZOFRAN) injection 4 mg (has no administration in time range)  furosemide (LASIX) injection 20 mg (has no administration in time range)  furosemide (LASIX) injection 20 mg (20 mg Intravenous Given 04/28/23 1446)    Mobility manual wheelchair     Focused Assessments Cardiac Assessment Handoff:  Cardiac Rhythm: Sinus tachycardia No results found for: "CKTOTAL", "CKMB", "CKMBINDEX", "TROPONINI" No results found for: "DDIMER" Does the Patient currently have chest pain? No    R Recommendations: See Admitting Provider Note  Report given to:   Additional Notes:

## 2023-04-28 NOTE — H&P (Signed)
History and Physical    Patient: [name]:[id number] DOB: 10/10/1924 DOA: 04/28/2023 DOS: the patient was seen and examined on 04/28/2023 PCP: Rodrigo Ran, MD  Patient coming from: Home  Chief Complaint:  Chief Complaint  Patient presents with   Leg swelling   HPI: [name] is a 89 y.o. male with medical history significant of atrial fibrillation on chronic anticoagulation, symptomatic bradycardia status post pacemaker who presents with complaints of leg swelling.  Patient makes note that he been feeling more short of breath with exertion and had developed leg swelling.  Daughter notes that his weight had been fluctuating recently elevated up to 171 pounds the other day and today reported to be 162 pounds.  He just recently had his pacemaker generator changed out on 11/14 by Dr. Nelly Laurence which his daughter seems to think set all of this into motion.  For the procedure he had to briefly hold his Eliquis, but he is currently back on the medication.  Denies having any significant redness or drainage from the pacemaker sit.  He had a lot of coughing with some intermittent wheezing.  He was seen by his primary care provider yesterday and started on Lasix 20 mg after being noted to have drop in O2 saturations.  Has follow-up appointment for the pacemaker change out was this morning and he was noted to have O2 saturations in the 70s   In the emergency department patient was noted to be afebrile with pulse 59-62, blood pressures maintained, and O2 saturations as low as 84% on room air.  Labs significant for hemoglobin 11.2, BUN 29, creatinine 1.28, BNP 353.2, and high-sensitivity troponins 13.  Chest x-ray noted minimal bibasilar segmental atelectasis or scarring.  Patient was given Lasix 20 mg IV x 1 dose.  Review of Systems: As mentioned in the history of present illness. All other systems reviewed and are negative. Past Medical History:  Diagnosis Date   Macular degeneration     Osteoporosis    Sick sinus syndrome [facility])    a. s/p MDT dual chamber PPM implant 11/2014   Past Surgical History:  Procedure Laterality Date   PACEMAKER INSERTION  11/2014   MDT dual chamber PPM implanted for SSS    PPM GENERATOR CHANGEOUT N/A 04/15/2023   Procedure: PPM GENERATOR CHANGEOUT;  Surgeon: Mealor, Roberts Gaudy, MD;  Location: [facility] INVASIVE CV LAB;  Service: Cardiovascular;  Laterality: N/A;   Social History:  reports that he has never smoked. He has never used smokeless tobacco. No history on file for alcohol use and drug use.  No Known Allergies  Family History  Problem Relation Age of Onset   Other Mother        OLD AGE   Stroke Father    Heart attack Brother    Cancer Brother    Cancer Brother    Healthy Son    Healthy Daughter     Prior to Admission medications   Medication Sig Start Date End Date Taking? Authorizing Provider  acetaminophen (TYLENOL) 500 MG tablet Take 500-1,000 mg by mouth every 6 (six) hours as needed for moderate pain (pain score 4-6).   Yes [provider]  apixaban (ELIQUIS) 5 MG TABS tablet Take 1 tablet (5 mg total) by mouth 2 (two) times daily. 04/01/23  Yes Graciella Freer, PA-C  Ascorbic Acid (VITAMIN C PO) Take 1 tablet by mouth daily.   Yes [provider]  cholecalciferol (VITAMIN D) 1000 UNITS tablet Take 1,000 Units by  mouth daily.   Yes [provider]  Cyanocobalamin (B-12 PO) Take 1 tablet by mouth daily.   Yes [provider]  ferrous sulfate 325 (65 FE) MG tablet Take 325 mg by mouth daily with breakfast.   Yes [provider]  furosemide (LASIX) 20 MG tablet Take 20 mg by mouth every other day. 04/27/23  Yes [provider]  potassium chloride (KLOR-CON) 10 MEQ tablet Take 10 mEq by mouth every other day. 04/27/23  Yes [provider]  traMADol (ULTRAM) 50 MG tablet Take 50 mg by mouth every 8 (eight) hours as needed for moderate pain (pain score 4-6). 03/04/20   Yes [provider]  zolpidem (AMBIEN) 5 MG tablet Take 5 mg by mouth at bedtime.   Yes [provider]  ciprofloxacin (CIPRO) 500 MG tablet Take 500 mg by mouth 2 (two) times daily. Patient not taking: Reported on 04/28/2023 04/21/23   [provider]    Physical Exam: Vitals:   04/28/23 1110 04/28/23 1230 04/28/23 1415  BP: 128/73 (!) 114/90   Pulse: 60 (!) 59 60  Resp: 13 16 11   Temp: 97.6 F (36.4 C)    TempSrc: Oral    SpO2: 96% 98% 95%    Constitutional: Elderly male currently in no acute distress Eyes: PERRL, lids and conjunctivae normal ENMT: Mucous membranes are moist.  Fair dentition.  Hard of hearing. Neck: normal, supple,  Respiratory: clear to auscultation bilaterally, no wheezing, no crackles. Normal respiratory effort. No accessory muscle use.  Cardiovascular: Regular rate and rhythm, no murmurs / rubs / gallops. No extremity edema. 2+ pedal pulses. No carotid bruits.  Abdomen: no tenderness, no masses palpated. No hepatosplenomegaly. Bowel sounds positive.  Musculoskeletal: no clubbing / cyanosis. No joint deformity upper and lower extremities. Good ROM, no contractures. Normal muscle tone.  Skin: no rashes, lesions, ulcers. No induration Neurologic: CN 2-12 grossly intact. Sensation intact, DTR normal. Strength 5/5 in all 4.  Psychiatric: Normal judgment and insight. Alert and oriented x 3. Normal mood.   Data Reviewed: {Tip this will not be part of the note when signed- Document your independent interpretation of telemetry tracing, EKG, lab, Radiology test or any other diagnostic tests. Add any new diagnostic test ordered today. (Optional):[id number]} EKG revealed a junctional rhythm at 63 bpm with nonspecific anterior ventricular conduction delay.  Reviewed labs, imaging, and pertinent records as document  Assessment and Plan:  Congestive heart failure exacerbation Assessment: Patient presents with signs and symptoms consistent with heart  failure exacerbation. He has elevated BNP (300+), 2-3+ pitting edema in both legs, and reports shortness of breath with exertion . Recent weight fluctuations noted, with an increase to 171 lbs yesterday and a decrease to 162 lbs today after starting furosemide. Chest x-ray showed atelectasis. Patient recently had a pacemaker change approximately 1-2 weeks ago.  Patient having given Lasix 20 mg IV x 1 dose -Admit to a cardiac telemetry bed -Strict I&O's and daily weights -Continue Lasix 20 mg IV twice daily -Check echocardiogram -[place] heart failure team notified of admission   Persistent atrial fibrillation on chronic anticoagulation Home medication regimen includes Eliquis.  Dr. Graciela Husbands had advised the patient to go back to taking Eliquis 5 mg twice daily. -   Symptomatic bradycardia status post pacemaker  Chronic kidney disease stage III Creatinine noted to be 1.28 with BUN 29 which is around patient's baseline. -Continue to monitor kidney function with IV diuresis  DVT prophylaxis: Eliquis Advance Care Planning:  Code Status: Full Code   Consults:   Family Communication: ***  Severity of Illness: The appropriate patient status for this patient is INPATIENT. Inpatient status is judged to be reasonable and necessary in order to provide the required intensity of service to ensure the patient's safety. The patient's presenting symptoms, physical exam findings, and initial radiographic and laboratory data in the context of their chronic comorbidities is felt to place them at high risk for further clinical deterioration. Furthermore, it is not anticipated that the patient will be medically stable for discharge from the hospital within 2 midnights of admission.   * I certify that at the point of admission it is my clinical judgment that the patient will require inpatient hospital care spanning beyond 2 midnights from the point of admission due to high intensity of service, high risk for  further deterioration and high frequency of surveillance required.*  Author: Clydie Braun, MD 04/28/2023 3:28 PM  For on call review [email or web address].

## 2023-04-28 NOTE — ED Triage Notes (Signed)
Pt BIB daughter from doctors appointment for increased leg swelling & decreased O2sats. Pt has a hx of Heart Failure.V/S stable upon arrival.

## 2023-04-28 NOTE — Progress Notes (Signed)
Patient in today for wound/device check appt 14 days post op gen PPM gen change on 04/15/2023.  Saw PCP yesterday with noted fluid retention, CXR shows evidence of fluid on lungs, and new dx of CHF.  He does not have a gen card at present.  Today VS:  105/62, HR 62 (Afib/Vp rhythm- device dependent).  PULSE OX 84-89% at rest.  Daughter states she has observed increased wheezing and patient notes ongoing SOB on exertion. He has significant BLE, neck vein distention and concerns for fluid on lungs with auscultation. Reviewed with Dr. Graciela Husbands.  He recommends given level of fluid retention and progression over past 24 hours, patient should go to ER for diuresing and connection for HF management.    Device wound site assessed. Steri-strips removed. Wound without redness or edema. Incision edges approximated, wound well healed.  There is a small amount of swelling present in pocket, Dr. Graciela Husbands DOD today reviewed.  No changes, patient/daughter is to continue to monitor for any increase in size or s/s of infection.  Normal device function. Thresholds, sensing, and impedances consistent with implant measurements. Device programmed appropriately for chronic leads. Histogram distribution appropriate for patient and level of activity. Programmed VVI.  1 NSVT 9 bests.  Patient does not have any restrictions with arm movement or lifting.    Daughter is taking patient over via car to [facility] ER immediately.  Report by Dr. Odessa Fleming nurse has been called to the triage team in the ER awaiting arrival.

## 2023-04-29 DIAGNOSIS — N179 Acute kidney failure, unspecified: Secondary | ICD-10-CM | POA: Diagnosis present

## 2023-04-29 DIAGNOSIS — I509 Heart failure, unspecified: Secondary | ICD-10-CM | POA: Diagnosis not present

## 2023-04-29 DIAGNOSIS — I5043 Acute on chronic combined systolic (congestive) and diastolic (congestive) heart failure: Secondary | ICD-10-CM | POA: Insufficient documentation

## 2023-04-29 DIAGNOSIS — I5033 Acute on chronic diastolic (congestive) heart failure: Secondary | ICD-10-CM

## 2023-04-29 DIAGNOSIS — I4821 Permanent atrial fibrillation: Secondary | ICD-10-CM | POA: Diagnosis present

## 2023-04-29 DIAGNOSIS — N1831 Chronic kidney disease, stage 3a: Secondary | ICD-10-CM | POA: Diagnosis not present

## 2023-04-29 DIAGNOSIS — I482 Chronic atrial fibrillation, unspecified: Secondary | ICD-10-CM | POA: Insufficient documentation

## 2023-04-29 DIAGNOSIS — Z950 Presence of cardiac pacemaker: Secondary | ICD-10-CM | POA: Insufficient documentation

## 2023-04-29 LAB — BASIC METABOLIC PANEL
Anion gap: 8 (ref 5–15)
BUN: 27 mg/dL — ABNORMAL HIGH (ref 8–23)
CO2: 26 mmol/L (ref 22–32)
Calcium: 8.7 mg/dL — ABNORMAL LOW (ref 8.9–10.3)
Chloride: 105 mmol/L (ref 98–111)
Creatinine, Ser: 1.46 mg/dL — ABNORMAL HIGH (ref 0.61–1.24)
GFR, Estimated: 44 mL/min — ABNORMAL LOW (ref >60)
Glucose, Bld: 94 mg/dL (ref 70–99)
Potassium: 3.9 mmol/L (ref 3.5–5.1)
Sodium: 139 mmol/L (ref 135–145)

## 2023-04-29 MED ORDER — APIXABAN 2.5 MG PO TABS
2.5000 mg | ORAL_TABLET | Freq: Two times a day (BID) | ORAL | Status: DC
  Administered 2023-04-29 (×2): 2.5 mg via ORAL
  Filled 2023-04-29 (×2): qty 1

## 2023-04-29 MED ORDER — FUROSEMIDE 20 MG PO TABS
10.0000 mg | ORAL_TABLET | Freq: Every day | ORAL | Status: DC
  Administered 2023-04-29 – 2023-04-30 (×2): 10 mg via ORAL
  Filled 2023-04-29 (×2): qty 1

## 2023-04-29 NOTE — Assessment & Plan Note (Addendum)
Base serum cr at 1,2   Volume status has improved, at the time of his discharge renal function with serum cr at 1,27 with K at 3,5 and serum bicarbonate at 24.  Na 138   Plan to continue diuresis with oral furosemide 10 mg daily. Follow up renal function in am.

## 2023-04-29 NOTE — Assessment & Plan Note (Addendum)
Echocardiogram with preserved LV systolic function with EF 55 to 60%, mild LVH, RV systolic function with mild reduction, RVSP 48.4 mmHg. LA and RA with severe dilatation,   Patient was placed on IV furosemide for diuresis, negative fluid balance was achieved with significant improvement in his symptoms.  Patient lost about 3 kg during his hospitalization.   Plan to continue medical therapy with furosemide 10 mg and spironolactone 12,5 mg.  Possible addition of ARB or ACE inh as outpatient if blood pressure stable.   Ted hose to lower extremities.

## 2023-04-29 NOTE — Progress Notes (Signed)
Progress Note   Patient: [name] UEA:[id number] DOB: November 04, 1924 DOA: 04/28/2023     1 DOS: the patient was seen and examined on 04/29/2023   Brief hospital course: [name] was admitted to the hospital with the working diagnosis of heart failure exacerbation.   89 yo male with the past medical history of atrial fibrillation, and bradycardia sp pacemaker who presented with lower extremity edema. Patient noted recently worsening dyspnea on exertion and lower extremity edema. He had his pacemaker generator changed 04/15/23.  As outpatient his primary care prescribed furosemide for his symptoms.  Follow up with EP on 11/27, noted 02 saturation 70%, and was referred to the ED for further evaluation.  On his initial physical examination his blood pressure was 114/90, HR 59, RR 13 and 02 saturation 84% on room air, lungs with rales bilaterally with no wheezing, heart with S1 and S2 present and regular, with no gallops or rubs, abdomen with no distention and positive lower extremity edema +++.   Na 137, K 4,0 Cl 106 bicarbonate 25, glucose 92 bun 29, cr 1,28  BNP 352 High sensitive troponin 13 and 12  Wbc 7,5 hgb 11.2 plt 197  Urine analysis SG 1,014, negative protein, negative hgb and negative wbc   [place] lower extremities negative for deep vein thrombosis.   Chest radiograph with cardiomegaly, mild bilateral hilar vascular congestion. Pacemaker in place with one right atrial and one ventricular lead.   EKG 63 bpm, right axis deviation, qrs 148, qtc 440, ventricular paced rhythm with no significant ST segment or T wave changes.   Assessment and Plan: * Acute on chronic diastolic CHF (congestive heart failure) (HCC) Echocardiogram with preserved LV systolic function with EF 55 to 60%, mild LVH, RV systolic function with mild reduction, RVSP 48.4 mmHg. LA and RA with severe dilatation,   Documented urine output is 1,950 ml Systolic blood pressure 119 mmHg.   Plan to continue diuresis  with furosemide, will transition to po 10 mg daily.  Add Ted hose. 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   Permanent atrial fibrillation (HCC) Ventricular paced rhythm.  Continue anticoagulation with apixaban, will reduce dost to 2.5 mg po bid.   Acute renal failure superimposed on stage 3a chronic kidney disease (HCC) Base serum cr at 1,2   Renal function with serum cr at 1,46 with K at 3,9 and serum bicarbonate at 26  Na 139.   Plan to continue diuresis with oral furosemide 10 mg daily. Follow up renal function in am.         Subjective: Patient with improvement in his symptoms, he continue to have lower extremity edema.   Physical Exam: Vitals:   04/28/23 1715 04/28/23 2001 04/28/23 2357 04/29/23 0422  BP: (!) 152/73 119/77 (!) 140/69 119/71  Pulse: 69 (!) 58 79 71  Resp: 16 17  18   Temp: 98.2 F (36.8 C) 97.6 F (36.4 C) 97.8 F (36.6 C) 97.9 F (36.6 C)  TempSrc: Oral Oral Oral Oral  SpO2: 96% 93% 91% 92%  Weight: 73.9 kg   72.3 kg  Height: 5\' 9"  (1.753 m)      Neurology awake and alert ENT with mild pallor Cardiovascular with S1 and S2 present, and regular with no gallops, rubs or murmurs No JVD Positive lower extremity edema distal legs and dependent +++ pitting  Respiratory with no rales or wheezing, no rhonchi Abdomen with no distention  Data Reviewed:    Family Communication: I spoke with  patient's daughter at the bedside, we talked in detail about patient's condition, plan of care and prognosis and all questions were addressed.   Disposition: Status is: Inpatient Remains inpatient appropriate because: possible discharge home tomorrow if renal function and blood pressure stable, pending evaluation of PT and OT.   Planned Discharge Destination: Home     Author: Coralie Keens, MD 04/29/2023 3:13 PM  For on call review [email or web address].

## 2023-04-29 NOTE — Progress Notes (Signed)
Eliquis 5mg  not given at this time. Cards rounding, per notes 2.5mg . Daughter wants to wait for rounding MD to come before giving the  dose.

## 2023-04-29 NOTE — Hospital Course (Signed)
[name] was admitted to the hospital with the working diagnosis of heart failure exacerbation.   89 yo male with the past medical history of atrial fibrillation, and bradycardia sp pacemaker who presented with lower extremity edema. Patient noted recently worsening dyspnea on exertion and lower extremity edema. He had his pacemaker generator changed 04/15/23.  As outpatient his primary care prescribed furosemide for his symptoms.  Follow up with EP on 11/27, noted 02 saturation 70%, and was referred to the ED for further evaluation.  On his initial physical examination his blood pressure was 114/90, HR 59, RR 13 and 02 saturation 84% on room air, lungs with rales bilaterally with no wheezing, heart with S1 and S2 present and regular, with no gallops or rubs, abdomen with no distention and positive lower extremity edema +++.   Na 137, K 4,0 Cl 106 bicarbonate 25, glucose 92 bun 29, cr 1,28  BNP 352 High sensitive troponin 13 and 12  Wbc 7,5 hgb 11.2 plt 197  Urine analysis SG 1,014, negative protein, negative hgb and negative wbc   [place] lower extremities negative for deep vein thrombosis.   Chest radiograph with cardiomegaly, mild bilateral hilar vascular congestion. Pacemaker in place with one right atrial and one ventricular lead.   EKG 63 bpm, right axis deviation, qrs 148, qtc 440, ventricular paced rhythm with no significant ST segment or T wave changes.

## 2023-04-29 NOTE — Assessment & Plan Note (Addendum)
Ventricular paced rhythm.  Patient continue to qualify for apixaban 5 mg po bid.

## 2023-04-29 NOTE — Progress Notes (Signed)
Cardiologist:  Klein/Crenshaw  Subjective:  Denies SSCP, palpitations or Dyspnea He has macular degeneration and can't see well Hearing aids not charged and can't hear well Most of discussion was with daughter   Objective:  Vitals:   04/28/23 1715 04/28/23 2001 04/28/23 2357 04/29/23 0422  BP: (!) 152/73 119/77 (!) 140/69 119/71  Pulse: 69 (!) 58 79 71  Resp: 16 17  18   Temp: 98.2 F (36.8 C) 97.6 F (36.4 C) 97.8 F (36.6 C) 97.9 F (36.6 C)  TempSrc: Oral Oral Oral Oral  SpO2: 96% 93% 91% 92%  Weight: 73.9 kg   72.3 kg  Height: 5\' 9"  (1.753 m)       Intake/Output from previous day:  Intake/Output Summary (Last 24 hours) at 04/29/2023 0803 Last data filed at 04/29/2023 0641 Gross per 24 hour  Intake 120 ml  Output 1950 ml  Net -1830 ml    Physical Exam: Elderly male Lungs clear SEM Abdomen benign Trace edema with venous reflux dx PPM under left clavicle  Lab Results: Basic Metabolic Panel: Recent Labs    04/28/23 1250 04/29/23 0221  NA 137 139  K 4.0 3.9  CL 106 105  CO2 25 26  GLUCOSE 92 94  BUN 29* 27*  CREATININE 1.28* 1.46*  CALCIUM 8.6* 8.7*   Liver Function Tests: Recent Labs    04/28/23 1250  AST 25  ALT 15  ALKPHOS 56  BILITOT 1.3*  PROT 6.6  ALBUMIN 2.8*   No results for input(s): "LIPASE", "AMYLASE" in the last 72 hours. CBC: Recent Labs    04/28/23 1250  WBC 7.5  NEUTROABS 4.4  HGB 11.2*  HCT 34.0*  MCV 98.6  PLT 197     Imaging: ECHOCARDIOGRAM COMPLETE  Result Date: 04/28/2023    ECHOCARDIOGRAM REPORT   Patient Name:   [name] Date of Exam: 04/28/2023 Medical Rec #:  [id number]        Height:       70.0 in Accession #:    [id number]       Weight:       168.0 lb Date of Birth:  03-01-1925         BSA:          1.938 m Patient Age:    89 years         BP:           114/90 mmHg Patient Gender: M                HR:           60 bpm. Exam Location:  Inpatient Procedure: 2D Echo, Color Doppler and Cardiac Doppler  Indications:    CHF  History:        Patient has no prior history of Echocardiogram examinations.                 CHF; Pacemaker.  Sonographer:    Milbert Coulter Referring Phys: [id number] RONDELL A SMITH IMPRESSIONS  1. Left ventricular ejection fraction, by estimation, is 55 to 60%. The left ventricle has normal function. The left ventricle has no regional wall motion abnormalities. There is mild concentric left ventricular hypertrophy. Left ventricular diastolic function could not be evaluated.  2. Right ventricular systolic function is mildly reduced. The right ventricular size is mildly enlarged. There is moderately elevated pulmonary artery systolic pressure. The estimated right ventricular systolic pressure is 48.4 mmHg.  3. Left atrial size was severely dilated.  4.  Right atrial size was severely dilated.  5. The mitral valve is normal in structure. Mild mitral valve regurgitation. No evidence of mitral stenosis.  6. The aortic valve is tricuspid. There is mild calcification of the aortic valve. There is mild thickening of the aortic valve. Aortic valve regurgitation is trivial.  7. The inferior vena cava is normal in size with <50% respiratory variability, suggesting right atrial pressure of 8 mmHg. FINDINGS  Left Ventricle: Left ventricular ejection fraction, by estimation, is 55 to 60%. The left ventricle has normal function. The left ventricle has no regional wall motion abnormalities. The left ventricular internal cavity size was normal in size. There is  mild concentric left ventricular hypertrophy. Abnormal (paradoxical) septal motion, consistent with RV pacemaker. Left ventricular diastolic function could not be evaluated due to atrial fibrillation. Left ventricular diastolic function could not be evaluated. Right Ventricle: The right ventricular size is mildly enlarged. Right vetricular wall thickness was not well visualized. Right ventricular systolic function is mildly reduced. There is moderately  elevated pulmonary artery systolic pressure. The tricuspid  regurgitant velocity is 3.18 m/s, and with an assumed right atrial pressure of 8 mmHg, the estimated right ventricular systolic pressure is 48.4 mmHg. Left Atrium: Left atrial size was severely dilated. Right Atrium: Right atrial size was severely dilated. Pericardium: There is no evidence of pericardial effusion. Mitral Valve: The mitral valve is normal in structure. Mild mitral valve regurgitation. No evidence of mitral valve stenosis. Tricuspid Valve: The tricuspid valve is normal in structure. Tricuspid valve regurgitation is mild. Aortic Valve: The aortic valve is tricuspid. There is mild calcification of the aortic valve. There is mild thickening of the aortic valve. Aortic valve regurgitation is trivial. Aortic valve mean gradient measures 2.0 mmHg. Aortic valve peak gradient measures 4.1 mmHg. Aortic valve area, by VTI measures 2.45 cm. Pulmonic Valve: The pulmonic valve was normal in structure. Pulmonic valve regurgitation is mild. No evidence of pulmonic stenosis. Aorta: The aortic root and ascending aorta are structurally normal, with no evidence of dilitation. Venous: The inferior vena cava is normal in size with less than 50% respiratory variability, suggesting right atrial pressure of 8 mmHg. IAS/Shunts: The interatrial septum was not well visualized. Additional Comments: A device lead is visualized in the right ventricle and right atrium.  LEFT VENTRICLE PLAX 2D LVIDd:         4.90 cm   Diastology LVIDs:         4.20 cm   LV e' medial:    8.05 cm/s LV PW:         1.20 cm   LV E/e' medial:  12.4 LV IVS:        1.30 cm   LV e' lateral:   13.30 cm/s LVOT diam:     2.10 cm   LV E/e' lateral: 7.5 LV SV:         52 LV SV Index:   27 LVOT Area:     3.46 cm  RIGHT VENTRICLE RV Basal diam:  4.40 cm RV Mid diam:    3.80 cm RV S prime:     11.30 cm/s TAPSE (M-mode): 1.4 cm LEFT ATRIUM             Index        RIGHT ATRIUM           Index LA diam:         4.90 cm 2.53 cm/m   RA Area:     22.70 cm  LA Vol (A2C):   67.0 ml 34.57 ml/m  RA Volume:   64.30 ml  33.17 ml/m LA Vol (A4C):   40.5 ml 20.90 ml/m LA Biplane Vol: 53.7 ml 27.71 ml/m  AORTIC VALVE AV Area (Vmax):    2.38 cm AV Area (Vmean):   2.21 cm AV Area (VTI):     2.45 cm AV Vmax:           101.00 cm/s AV Vmean:          71.300 cm/s AV VTI:            0.212 m AV Peak Grad:      4.1 mmHg AV Mean Grad:      2.0 mmHg LVOT Vmax:         69.40 cm/s LVOT Vmean:        45.400 cm/s LVOT VTI:          0.150 m LVOT/AV VTI ratio: 0.71  AORTA Ao Root diam: 4.00 cm Ao Asc diam:  3.80 cm MITRAL VALVE               TRICUSPID VALVE MV Area (PHT): 4.80 cm    TR Peak grad:   40.4 mmHg MV Decel Time: 158 msec    TR Vmax:        318.00 cm/s MV E velocity: 99.90 cm/s MV A velocity: 28.10 cm/s  SHUNTS MV E/A ratio:  3.56        Systemic VTI:  0.15 m                            Systemic Diam: 2.10 cm Rachelle Hora Croitoru MD Electronically signed by Thurmon Fair MD Signature Date/Time: 04/28/2023/7:35:41 PM    Final    VAS [place] LOWER EXTREMITY VENOUS (DVT) (7a-7p)  Result Date: 04/28/2023  Lower Venous DVT Study Patient Name:  [name]  Date of Exam:   04/28/2023 Medical Rec #: [id number]         Accession #:    [id number] Date of Birth: 04-20-1925          Patient Gender: M Patient Age:   67 years Exam Location:  [facility] Procedure:      VAS [place] LOWER EXTREMITY VENOUS (DVT) Referring Phys: Lorin Picket [place] --------------------------------------------------------------------------------  Indications: Swelling, right calf, and Edema.  Comparison Study: No priors. Performing Technologist: Marilynne Halsted RDMS, RVT  Examination Guidelines: A complete evaluation includes B-mode imaging, spectral Doppler, color Doppler, and power Doppler as needed of all accessible portions of each vessel. Bilateral testing is considered an integral part of a complete examination. Limited examinations for reoccurring indications may  be performed as noted. The reflux portion of the exam is performed with the patient in reverse Trendelenburg.  +---------+---------------+---------+-----------+----------+--------------+ RIGHT    CompressibilityPhasicitySpontaneityPropertiesThrombus Aging +---------+---------------+---------+-----------+----------+--------------+ CFV      Full           Yes      Yes                                 +---------+---------------+---------+-----------+----------+--------------+ SFJ      Full                                                        +---------+---------------+---------+-----------+----------+--------------+  FV Prox  Full                                                        +---------+---------------+---------+-----------+----------+--------------+ FV Mid   Full                                                        +---------+---------------+---------+-----------+----------+--------------+ FV DistalFull                                                        +---------+---------------+---------+-----------+----------+--------------+ PFV      Full                                                        +---------+---------------+---------+-----------+----------+--------------+ POP      Full           Yes      Yes                                 +---------+---------------+---------+-----------+----------+--------------+ PTV      Full                                                        +---------+---------------+---------+-----------+----------+--------------+ PERO     Full                                                        +---------+---------------+---------+-----------+----------+--------------+   +----+---------------+---------+-----------+----------+--------------+ LEFTCompressibilityPhasicitySpontaneityPropertiesThrombus Aging +----+---------------+---------+-----------+----------+--------------+ CFV Full           Yes       Yes                                 +----+---------------+---------+-----------+----------+--------------+     Summary: RIGHT: - There is no evidence of deep vein thrombosis in the lower extremity.  - No cystic structure found in the popliteal fossa.  LEFT: - No evidence of common femoral vein obstruction.   *See table(s) above for measurements and observations. Electronically signed by Heath Lark on 04/28/2023 at 4:47:34 PM.    Final    DG Chest 2 View  Result Date: 04/28/2023 CLINICAL DATA:  Shortness of breath. EXAM: CHEST - 2 VIEW COMPARISON:  August 10, 2007. FINDINGS: The heart size and mediastinal contours are within normal limits. Minimal bibasilar subsegmental atelectasis or scarring is noted. Left-sided pacemaker is noted. The visualized skeletal structures are  unremarkable. IMPRESSION: Minimal bibasilar subsegmental atelectasis or scarring. Electronically Signed   By: Lupita Raider M.D.   On: 04/28/2023 13:01   CUP PACEART INCLINIC DEVICE CHECK  Result Date: 04/28/2023 Patient in today for wound/device check appt 14 days post op gen PPM gen change on 04/15/2023.  Saw PCP yesterday with noted fluid retention, CXR shows evidence of fluid on lungs, and new dx of CHF.  He does not have a gen card at present.  Today VS:  105/62, HR 62 (Afib/Vp rhythm- device dependent).  PULSE OX 84-89% at rest.  Daughter states she has observed increased wheezing and patient notes ongoing SOB on exertion. He has significant BLE, neck vein distention and concerns for fluid on lungs with auscultation. Reviewed with Dr. Graciela Husbands.  He recommends given level of fluid retention and progression over past 24 hours, patient should go to ER for diuresing and connection for HF management.  Device wound site assessed. Steri-strips removed. Wound without redness or edema. Incision edges approximated, wound well healed.  There is a small amount of swelling present in pocket, Dr. Graciela Husbands DOD today reviewed.  No changes,  patient/daughter is to continue to monitor for any increase in size or s/s of infection. Normal device function. Thresholds, sensing, and impedances consistent with implant measurements. Device programmed appropriately for chronic leads. Histogram distribution appropriate for patient and level of activity. Programmed VVI.  1 NSVT 9 bests.  Patient does not have any restrictions with arm movement or lifting.  Daughter is taking patient over via car to [facility] ER immediately.  Report by Dr. Odessa Fleming nurse has been called to the triage team in the ER awaiting arrival.Mandy Ardelle Anton, RN   Cardiac Studies:  ECG: Afib with V pacing    Telemetry: Afib with V pacing   Echo: EF 55-60% with mild RVE  Medications:    apixaban  5 mg Oral BID   furosemide  20 mg Intravenous BID   sodium chloride flush  3 mL Intravenous Q12H   zolpidem  5 mg Oral QHS      sodium chloride      Assessment/Plan:   CHF:  ? Diastolic vs more mild right heart failure from pacing lead across TV and LE venous dx. Very sensitive to lasix with brisk diuresis 1.8 L Lungs Clear CXR only atelectasis and BNP only 353.  Ok to d/c home would only use 10 mg PO lasix on d/c  Afib:  chronic rates are fine with PPM back up on Given age Cr 1.46 and potential to be higher now on diuretic favor d/c with 2.5 mg eliquis bid can adjust as outpatient  PPM normal back up function   Ok to d/c back to [place] today will arrange f/u with Dr Jens Som general cards and Dr Graciela Husbands EP  Charlton Haws 04/29/2023, 8:03 AM

## 2023-04-29 NOTE — Progress Notes (Signed)
Pt refused TEDs, stated will put it on in the morning.

## 2023-04-30 ENCOUNTER — Encounter (HOSPITAL_COMMUNITY): Payer: Self-pay | Admitting: Internal Medicine

## 2023-04-30 ENCOUNTER — Other Ambulatory Visit (HOSPITAL_COMMUNITY): Payer: Self-pay

## 2023-04-30 DIAGNOSIS — N179 Acute kidney failure, unspecified: Secondary | ICD-10-CM | POA: Diagnosis not present

## 2023-04-30 DIAGNOSIS — I5033 Acute on chronic diastolic (congestive) heart failure: Secondary | ICD-10-CM | POA: Diagnosis not present

## 2023-04-30 DIAGNOSIS — D509 Iron deficiency anemia, unspecified: Secondary | ICD-10-CM

## 2023-04-30 DIAGNOSIS — N1831 Chronic kidney disease, stage 3a: Secondary | ICD-10-CM | POA: Diagnosis not present

## 2023-04-30 DIAGNOSIS — I4821 Permanent atrial fibrillation: Secondary | ICD-10-CM | POA: Diagnosis not present

## 2023-04-30 LAB — BASIC METABOLIC PANEL
Anion gap: 7 (ref 5–15)
BUN: 28 mg/dL — ABNORMAL HIGH (ref 8–23)
CO2: 24 mmol/L (ref 22–32)
Calcium: 8.4 mg/dL — ABNORMAL LOW (ref 8.9–10.3)
Chloride: 107 mmol/L (ref 98–111)
Creatinine, Ser: 1.27 mg/dL — ABNORMAL HIGH (ref 0.61–1.24)
GFR, Estimated: 52 mL/min — ABNORMAL LOW (ref >60)
Glucose, Bld: 98 mg/dL (ref 70–99)
Potassium: 3.5 mmol/L (ref 3.5–5.1)
Sodium: 138 mmol/L (ref 135–145)

## 2023-04-30 MED ORDER — SPIRONOLACTONE 25 MG PO TABS
12.5000 mg | ORAL_TABLET | Freq: Every day | ORAL | 0 refills | Status: DC
  Filled 2023-04-30 (×2): qty 15, 30d supply, fill #0

## 2023-04-30 MED ORDER — FUROSEMIDE 20 MG PO TABS
10.0000 mg | ORAL_TABLET | Freq: Every day | ORAL | 0 refills | Status: DC
  Filled 2023-04-30: qty 30, 60d supply, fill #0

## 2023-04-30 MED ORDER — SPIRONOLACTONE 12.5 MG HALF TABLET
12.5000 mg | ORAL_TABLET | Freq: Every day | ORAL | Status: DC
  Administered 2023-04-30: 12.5 mg via ORAL
  Filled 2023-04-30: qty 1

## 2023-04-30 MED ORDER — APIXABAN 5 MG PO TABS
5.0000 mg | ORAL_TABLET | Freq: Two times a day (BID) | ORAL | Status: DC
  Administered 2023-04-30: 5 mg via ORAL
  Filled 2023-04-30: qty 1

## 2023-04-30 NOTE — Assessment & Plan Note (Signed)
Continue with oral iron supplementation, follow up iron panel as outpatient.

## 2023-04-30 NOTE — Evaluation (Signed)
Occupational Therapy Evaluation Patient Details Name: [name] MRN: [id number] DOB: [birth year] Today's Date: 04/30/2023   History of Present Illness 89yo male who presented on 04/28/23 with c/o DOE and BLE edema, wt fluctuations. Had PPM generator change out on 04/15/23 and symptoms seemed to start shortly after this. Referred to ED at appt for PPM change out f/u due SPO2 in the 70s. Ultimately admitted with hypoxia and CHF exacerbation. PMH macular degeneration, osteoporosis, SSS s/p PPM, PPM generator change out 04/15/23   Clinical Impression   Pt in good spirits, eager to return home, daughter present during session. Pt lives in ILF, PLOF independent. Pt currently close to baseline, feels a little weak, had an instance of LOB while standing with posterior lean, used bed rail to self correct. Pt  able to complete ADLs independently, supervision for transfers/mobility with RW for safety. Recommending shower seat and RW for DC home, no OT follow up needed. No further acute OT needs.      If plan is discharge home, recommend the following: A little help with walking and/or transfers;Assist for transportation    Functional Status Assessment  Patient has had a recent decline in their functional status and demonstrates the ability to make significant improvements in function in a reasonable and predictable amount of time.  Equipment Recommendations  Tub/shower seat;Other (comment) (RW)    Recommendations for Other Services       Precautions / Restrictions Precautions Precautions: ICD/Pacemaker Restrictions Weight Bearing Restrictions: No      Mobility Bed Mobility Overal bed mobility: Independent                  Transfers Overall transfer level: Needs assistance Equipment used: None Transfers: Sit to/from Stand, Bed to chair/wheelchair/BSC Sit to Stand: Independent     Step pivot transfers: Supervision     General transfer comment: supervision for safety       Balance Overall balance assessment: Needs assistance Sitting-balance support: No upper extremity supported, Feet supported Sitting balance-Leahy Scale: Good Sitting balance - Comments: recliner ADLs   Standing balance support: No upper extremity supported Standing balance-Leahy Scale: Good Standing balance comment: did have one instance of LOB when standing, fell back but able to self correct with use of bed rail                           ADL either performed or assessed with clinical judgement   ADL Overall ADL's : At baseline;Modified independent                                       General ADL Comments: did have one instance of LOB but able to self correct with help of bedside rail, supervision with RW for safety with standing/ambulation. Pt mod I for ADLs     Vision         Perception         Praxis         Pertinent Vitals/Pain Pain Assessment Pain Assessment: No/denies pain     Extremity/Trunk Assessment Upper Extremity Assessment Upper Extremity Assessment: Overall WFL for tasks assessed   Lower Extremity Assessment Lower Extremity Assessment: Defer to PT evaluation   Cervical / Trunk Assessment Cervical / Trunk Assessment: Kyphotic   Communication Communication Communication: No apparent difficulties   Cognition Arousal: Alert Behavior During Therapy: WFL for tasks assessed/performed Overall  Cognitive Status: Within Functional Limits for tasks assessed                                       General Comments       Exercises     Shoulder Instructions      Home Living Family/patient expects to be discharged to:: Other (Comment)                                 Additional Comments: at independent living at [facility]      Prior Functioning/Environment Prior Level of Function : Independent/Modified Independent             Mobility Comments: used RW was independent with all  mobility, going to gym in facility, not driving          OT Problem List: Decreased strength;Decreased activity tolerance;Impaired balance (sitting and/or standing)      OT Treatment/Interventions:      OT Goals(Current goals can be found in the care plan section) Acute Rehab OT Goals Patient Stated Goal: to return home OT Goal Formulation: With patient/family Time For Goal Achievement: 05/14/23 Potential to Achieve Goals: Good  OT Frequency:      Co-evaluation              AM-PAC OT "6 Clicks" Daily Activity     Outcome Measure Help from another person eating meals?: None Help from another person taking care of personal grooming?: None Help from another person toileting, which includes using toliet, bedpan, or urinal?: None Help from another person bathing (including washing, rinsing, drying)?: A Little Help from another person to put on and taking off regular upper body clothing?: None Help from another person to put on and taking off regular lower body clothing?: None 6 Click Score: 23   End of Session Equipment Utilized During Treatment: Gait belt;Rolling walker (2 wheels) Nurse Communication: Mobility status  Activity Tolerance: Patient tolerated treatment well Patient left: in chair;with call bell/phone within reach;with family/visitor present  OT Visit Diagnosis: Unsteadiness on feet (R26.81);Other abnormalities of gait and mobility (R26.89);Muscle weakness (generalized) (M62.81)                Time: 8416-6063 OT Time Calculation (min): 25 min Charges:  OT General Charges $OT Visit: 1 Visit OT Evaluation $OT Eval Low Complexity: 1 Low OT Treatments $Self Care/Home Management : 8-22 mins  [place], OTR/L   Alexis Goodell 04/30/2023, 10:21 AM

## 2023-04-30 NOTE — Discharge Summary (Addendum)
Physician Discharge Summary   Patient: [name] MRN: [id number] DOB: 14-Jan-1925  Admit date:     04/28/2023  Discharge date: 04/30/23  Discharge Physician: York Ram Hyrum Shaneyfelt   PCP: Maurice Ran, MD   Recommendations at discharge:    Patient has been placed on spironolactone for diastolic heart failure, if blood pressure stable as outpatient patient will benefit from ace inh or ARB.  Continue diuresis with furosemide 10 mg, to increase to 20 mg in case of volume overload, 2 to 3 lbs weight gain in 24 hrs or 5 lbs in 7 days.  Continue anticoagulation with apixaban 5 mg bid Continue ted hose/ compression socks to lower extremities.  Home health services with physical and occupational therapy, home health nurse.  Follow up renal function and electrolytes within 7 days. Follow up with Dr Waynard Edwards as scheduled next week. Follow up with Cardiology as scheduled.   I spoke with patient's daughter at the bedside, we talked in detail about patient's condition, plan of care and prognosis and all questions were addressed.   Discharge Diagnoses: Principal Problem:   Acute on chronic diastolic CHF (congestive heart failure) (HCC) Active Problems:   Permanent atrial fibrillation (HCC)   Acute renal failure superimposed on stage 3a chronic kidney disease (HCC)  Resolved Problems:   * No resolved hospital problems. [facility] Course: Mr. Salami was admitted to the hospital with the working diagnosis of heart failure exacerbation.   89 yo male with the past medical history of atrial fibrillation, and bradycardia sp pacemaker who presented with lower extremity edema. Patient noted recently worsening dyspnea on exertion and lower extremity edema. He had his pacemaker generator changed 04/15/23.  As outpatient his primary care prescribed furosemide for his symptoms.  Follow up with EP on 11/27, noted 02 saturation 70%, and was referred to the ED for further evaluation.  On his initial  physical examination his blood pressure was 114/90, HR 59, RR 13 and 02 saturation 84% on room air, lungs with rales bilaterally with no wheezing, heart with S1 and S2 present and regular, with no gallops or rubs, abdomen with no distention and positive lower extremity edema +++.   Na 137, K 4,0 Cl 106 bicarbonate 25, glucose 92 bun 29, cr 1,28  BNP 352 High sensitive troponin 13 and 12  Wbc 7,5 hgb 11.2 plt 197  Urine analysis SG 1,014, negative protein, negative hgb and negative wbc   [place] lower extremities negative for deep vein thrombosis.   Chest radiograph with cardiomegaly, mild bilateral hilar vascular congestion. Pacemaker in place with one right atrial and one ventricular lead.   EKG 63 bpm, right axis deviation, qrs 148, qtc 440, ventricular paced rhythm with no significant ST segment or T wave changes.   Assessment and Plan: * Acute on chronic diastolic CHF (congestive heart failure) (HCC) Echocardiogram with preserved LV systolic function with EF 55 to 60%, mild LVH, RV systolic function with mild reduction, RVSP 48.4 mmHg. LA and RA with severe dilatation,   Patient was placed on IV furosemide for diuresis, negative fluid balance was achieved with significant improvement in his symptoms.  Patient lost about 3 kg during his hospitalization.   Plan to continue medical therapy with furosemide 10 mg and spironolactone 12,5 mg.  Possible addition of ARB or ACE inh as outpatient if blood pressure stable.   Ted hose to lower extremities.   Permanent atrial fibrillation (HCC) Ventricular paced rhythm.  Patient continue to qualify for apixaban 5 mg  po bid.   Acute renal failure superimposed on stage 3a chronic kidney disease (HCC) Base serum cr at 1,2   Volume status has improved, at the time of his discharge renal function with serum cr at 1,27 with K at 3,5 and serum bicarbonate at 24.  Na 138   Plan to continue diuresis with oral furosemide 10 mg daily. Follow up renal  function in am.   Iron deficiency anemia Continue with oral iron supplementation, follow up iron panel as outpatient.          Consultants: cardiology  Procedures performed: none   Disposition: Home Diet recommendation:  Cardiac diet DISCHARGE MEDICATION: Allergies as of 04/30/2023   No Known Allergies      Medication List     STOP taking these medications    potassium chloride 10 MEQ tablet Commonly known as: KLOR-CON       TAKE these medications    acetaminophen 500 MG tablet Commonly known as: TYLENOL Take 500-1,000 mg by mouth every 6 (six) hours as needed for moderate pain (pain score 4-6).   apixaban 5 MG Tabs tablet Commonly known as: Eliquis Take 1 tablet (5 mg total) by mouth 2 (two) times daily.   B-12 PO Take 1 tablet by mouth daily.   cholecalciferol 1000 units tablet Commonly known as: VITAMIN D Take 1,000 Units by mouth daily.   ferrous sulfate 325 (65 FE) MG tablet Take 325 mg by mouth daily with breakfast.   furosemide 20 MG tablet Commonly known as: LASIX Take 0.5 tablets (10 mg total) by mouth daily. What changed:  how much to take when to take this   spironolactone 25 MG tablet Commonly known as: ALDACTONE Take 0.5 tablets (12.5 mg total) by mouth daily.   traMADol 50 MG tablet Commonly known as: ULTRAM Take 50 mg by mouth every 8 (eight) hours as needed for moderate pain (pain score 4-6).   VITAMIN C PO Take 1 tablet by mouth daily.   zolpidem 5 MG tablet Commonly known as: AMBIEN Take 5 mg by mouth at bedtime.        Discharge Exam: Filed Weights   04/28/23 1715 04/29/23 0422 04/30/23 0438  Weight: 73.9 kg 72.3 kg 71.2 kg   BP 120/75 (BP Location: Left Arm)   Pulse 68   Temp 97.7 F (36.5 C) (Oral)   Resp 17   Ht 5\' 9"  (1.753 m)   Wt 71.2 kg   SpO2 92%   BMI 23.18 kg/m   Patient is feeling better, no chest pain or dyspnea, no PND or orthopnea, lower extremity edema has improved.   Neurology awake and  alert ENT with mild pallor Cardiovascular with S1 and S2 present and regular with no gallops, rubs or murmurs No JVD Trace lower extremity edema, ted hose in place Respiratory with no rales or wheezing, no rhonchi Abdomen with no distention   Condition at discharge: stable  The results of significant diagnostics from this hospitalization (including imaging, microbiology, ancillary and laboratory) are listed below for reference.   Imaging Studies: ECHOCARDIOGRAM COMPLETE  Result Date: 04/28/2023    ECHOCARDIOGRAM REPORT   Patient Name:   KEYONN KUBICK Date of Exam: 04/28/2023 Medical Rec #:  [id number]        Height:       70.0 in Accession #:    [id number]       Weight:       168.0 lb Date of Birth:  23-May-1925  BSA:          1.938 m Patient Age:    89 years         BP:           114/90 mmHg Patient Gender: M                HR:           60 bpm. Exam Location:  Inpatient Procedure: 2D Echo, Color Doppler and Cardiac Doppler Indications:    CHF  History:        Patient has no prior history of Echocardiogram examinations.                 CHF; Pacemaker.  Sonographer:    Milbert Coulter Referring Phys: [id number] RONDELL A SMITH IMPRESSIONS  1. Left ventricular ejection fraction, by estimation, is 55 to 60%. The left ventricle has normal function. The left ventricle has no regional wall motion abnormalities. There is mild concentric left ventricular hypertrophy. Left ventricular diastolic function could not be evaluated.  2. Right ventricular systolic function is mildly reduced. The right ventricular size is mildly enlarged. There is moderately elevated pulmonary artery systolic pressure. The estimated right ventricular systolic pressure is 48.4 mmHg.  3. Left atrial size was severely dilated.  4. Right atrial size was severely dilated.  5. The mitral valve is normal in structure. Mild mitral valve regurgitation. No evidence of mitral stenosis.  6. The aortic valve is tricuspid. There is mild  calcification of the aortic valve. There is mild thickening of the aortic valve. Aortic valve regurgitation is trivial.  7. The inferior vena cava is normal in size with <50% respiratory variability, suggesting right atrial pressure of 8 mmHg. FINDINGS  Left Ventricle: Left ventricular ejection fraction, by estimation, is 55 to 60%. The left ventricle has normal function. The left ventricle has no regional wall motion abnormalities. The left ventricular internal cavity size was normal in size. There is  mild concentric left ventricular hypertrophy. Abnormal (paradoxical) septal motion, consistent with RV pacemaker. Left ventricular diastolic function could not be evaluated due to atrial fibrillation. Left ventricular diastolic function could not be evaluated. Right Ventricle: The right ventricular size is mildly enlarged. Right vetricular wall thickness was not well visualized. Right ventricular systolic function is mildly reduced. There is moderately elevated pulmonary artery systolic pressure. The tricuspid  regurgitant velocity is 3.18 m/s, and with an assumed right atrial pressure of 8 mmHg, the estimated right ventricular systolic pressure is 48.4 mmHg. Left Atrium: Left atrial size was severely dilated. Right Atrium: Right atrial size was severely dilated. Pericardium: There is no evidence of pericardial effusion. Mitral Valve: The mitral valve is normal in structure. Mild mitral valve regurgitation. No evidence of mitral valve stenosis. Tricuspid Valve: The tricuspid valve is normal in structure. Tricuspid valve regurgitation is mild. Aortic Valve: The aortic valve is tricuspid. There is mild calcification of the aortic valve. There is mild thickening of the aortic valve. Aortic valve regurgitation is trivial. Aortic valve mean gradient measures 2.0 mmHg. Aortic valve peak gradient measures 4.1 mmHg. Aortic valve area, by VTI measures 2.45 cm. Pulmonic Valve: The pulmonic valve was normal in structure.  Pulmonic valve regurgitation is mild. No evidence of pulmonic stenosis. Aorta: The aortic root and ascending aorta are structurally normal, with no evidence of dilitation. Venous: The inferior vena cava is normal in size with less than 50% respiratory variability, suggesting right atrial pressure of 8 mmHg. IAS/Shunts: The interatrial septum was not  well visualized. Additional Comments: A device lead is visualized in the right ventricle and right atrium.  LEFT VENTRICLE PLAX 2D LVIDd:         4.90 cm   Diastology LVIDs:         4.20 cm   LV e' medial:    8.05 cm/s LV PW:         1.20 cm   LV E/e' medial:  12.4 LV IVS:        1.30 cm   LV e' lateral:   13.30 cm/s LVOT diam:     2.10 cm   LV E/e' lateral: 7.5 LV SV:         52 LV SV Index:   27 LVOT Area:     3.46 cm  RIGHT VENTRICLE RV Basal diam:  4.40 cm RV Mid diam:    3.80 cm RV S prime:     11.30 cm/s TAPSE (M-mode): 1.4 cm LEFT ATRIUM             Index        RIGHT ATRIUM           Index LA diam:        4.90 cm 2.53 cm/m   RA Area:     22.70 cm LA Vol (A2C):   67.0 ml 34.57 ml/m  RA Volume:   64.30 ml  33.17 ml/m LA Vol (A4C):   40.5 ml 20.90 ml/m LA Biplane Vol: 53.7 ml 27.71 ml/m  AORTIC VALVE AV Area (Vmax):    2.38 cm AV Area (Vmean):   2.21 cm AV Area (VTI):     2.45 cm AV Vmax:           101.00 cm/s AV Vmean:          71.300 cm/s AV VTI:            0.212 m AV Peak Grad:      4.1 mmHg AV Mean Grad:      2.0 mmHg LVOT Vmax:         69.40 cm/s LVOT Vmean:        45.400 cm/s LVOT VTI:          0.150 m LVOT/AV VTI ratio: 0.71  AORTA Ao Root diam: 4.00 cm Ao Asc diam:  3.80 cm MITRAL VALVE               TRICUSPID VALVE MV Area (PHT): 4.80 cm    TR Peak grad:   40.4 mmHg MV Decel Time: 158 msec    TR Vmax:        318.00 cm/s MV E velocity: 99.90 cm/s MV A velocity: 28.10 cm/s  SHUNTS MV E/A ratio:  3.56        Systemic VTI:  0.15 m                            Systemic Diam: 2.10 cm Rachelle Hora Croitoru MD Electronically signed by Thurmon Fair MD Signature  Date/Time: 04/28/2023/7:35:41 PM    Final    VAS [place] LOWER EXTREMITY VENOUS (DVT) (7a-7p)  Result Date: 04/28/2023  Lower Venous DVT Study Patient Name:  CHISHOLM CONARY  Date of Exam:   04/28/2023 Medical Rec #: [id number]         Accession #:    [id number] Date of Birth: 03/27/1925          Patient Gender: M Patient Age:   72  years Exam Location:  [facility] Procedure:      VAS [place] LOWER EXTREMITY VENOUS (DVT) Referring Phys: Lorin Picket G[place] --------------------------------------------------------------------------------  Indications: Swelling, right calf, and Edema.  Comparison Study: No priors. Performing Technologist: Marilynne Halsted RDMS, RVT  Examination Guidelines: A complete evaluation includes B-mode imaging, spectral Doppler, color Doppler, and power Doppler as needed of all accessible portions of each vessel. Bilateral testing is considered an integral part of a complete examination. Limited examinations for reoccurring indications may be performed as noted. The reflux portion of the exam is performed with the patient in reverse Trendelenburg.  +---------+---------------+---------+-----------+----------+--------------+ RIGHT    CompressibilityPhasicitySpontaneityPropertiesThrombus Aging +---------+---------------+---------+-----------+----------+--------------+ CFV      Full           Yes      Yes                                 +---------+---------------+---------+-----------+----------+--------------+ SFJ      Full                                                        +---------+---------------+---------+-----------+----------+--------------+ FV Prox  Full                                                        +---------+---------------+---------+-----------+----------+--------------+ FV Mid   Full                                                        +---------+---------------+---------+-----------+----------+--------------+ FV DistalFull                                                         +---------+---------------+---------+-----------+----------+--------------+ PFV      Full                                                        +---------+---------------+---------+-----------+----------+--------------+ POP      Full           Yes      Yes                                 +---------+---------------+---------+-----------+----------+--------------+ PTV      Full                                                        +---------+---------------+---------+-----------+----------+--------------+ PERO     Full                                                        +---------+---------------+---------+-----------+----------+--------------+   +----+---------------+---------+-----------+----------+--------------+  LEFTCompressibilityPhasicitySpontaneityPropertiesThrombus Aging +----+---------------+---------+-----------+----------+--------------+ CFV Full           Yes      Yes                                 +----+---------------+---------+-----------+----------+--------------+     Summary: RIGHT: - There is no evidence of deep vein thrombosis in the lower extremity.  - No cystic structure found in the popliteal fossa.  LEFT: - No evidence of common femoral vein obstruction.   *See table(s) above for measurements and observations. Electronically signed by Heath Lark on 04/28/2023 at 4:47:34 PM.    Final    DG Chest 2 View  Result Date: 04/28/2023 CLINICAL DATA:  Shortness of breath. EXAM: CHEST - 2 VIEW COMPARISON:  August 10, 2007. FINDINGS: The heart size and mediastinal contours are within normal limits. Minimal bibasilar subsegmental atelectasis or scarring is noted. Left-sided pacemaker is noted. The visualized skeletal structures are unremarkable. IMPRESSION: Minimal bibasilar subsegmental atelectasis or scarring. Electronically Signed   By: Lupita Raider M.D.   On: 04/28/2023 13:01   [facility]  DEVICE CHECK  Result Date: 04/28/2023 Patient in today for wound/device check appt 14 days post op gen PPM gen change on 04/15/2023.  Saw PCP yesterday with noted fluid retention, CXR shows evidence of fluid on lungs, and new dx of CHF.  He does not have a gen card at present.  Today VS:  105/62, HR 62 (Afib/Vp rhythm- device dependent).  PULSE OX 84-89% at rest.  Daughter states she has observed increased wheezing and patient notes ongoing SOB on exertion. He has significant BLE, neck vein distention and concerns for fluid on lungs with auscultation. Reviewed with Dr. Graciela Husbands.  He recommends given level of fluid retention and progression over past 24 hours, patient should go to ER for diuresing and connection for HF management.  Device wound site assessed. Steri-strips removed. Wound without redness or edema. Incision edges approximated, wound well healed.  There is a small amount of swelling present in pocket, Dr. Graciela Husbands DOD today reviewed.  No changes, patient/daughter is to continue to monitor for any increase in size or s/s of infection. Normal device function. Thresholds, sensing, and impedances consistent with implant measurements. Device programmed appropriately for chronic leads. Histogram distribution appropriate for patient and level of activity. Programmed VVI.  1 NSVT 9 bests.  Patient does not have any restrictions with arm movement or lifting.  Daughter is taking patient over via car to [facility] ER immediately.  Report by Dr. Odessa Fleming nurse has been called to the triage team in the ER awaiting arrival.Mandy Ardelle Anton, RN  EP PPM/ICD IMPLANT  Result Date: 04/15/2023 Table formatting from the original result was not included. SURGEON:  Maurice Small, MD    PREPROCEDURE DIAGNOSES:  1. Pacemaker at [facility]    POSTPROCEDURE DIAGNOSES:  Same as preprocedure.  PROCEDURES:   1. pulse generator replacement   INTRODUCTION:  ADARIOUS SHAMEL is a 89 y.o. male with AF and CHB and prior dual chamber pacemaker  implantation who presents today for generator replacement due to ERI battery status.   DESCRIPTION OF PROCEDURE:  Informed written consent was obtained and the patient was brought to the electrophysiology lab in the fasting state. Moderate sedation was administered during the procedure.  The patient's left chest was prepped and draped in the usual sterile fashion by the EP lab staff.  The skin overlying the  left deltopectoral region was infiltrated with lidocaine for local analgesia.  An incision was made over the existing pocket.  Electrocautery was used to assure hemostasis.  The device was exposed and removed from the pocket.  The device was disconnected from the leads.  The leads were examined thoroughly and their integrity confirmed to be intact.  RA RV Model [id number] [id number] Serial # [id number] Amplitude 0.8 mV NA - V paced Threshold NA - AF, VVI 0.5 V @ 0.4 S Impedence 342 ohms 380 ohms All three leads were then connected to a new pulse generator (model Azure DR MRI, serial [id number]). The pocket was revised to accomodate this new device.   The pocket was  irrigated with antibiotic solution.  The generator was returned to the pocket within an antibiotic envelope.  The pocket was then closed in layers of absorbable suture. EBL<45ml. Steri-Strips and a  sterile dressing were then applied.  There were no early apparent complications.     CONCLUSIONS:  1. Generator at elective replacement indicator  3. Successful generator replacement with a dual chamber device  4. No early apparent complications. Maurice Small, MD Cardiac Electrophysiology   [facility] DEVICE CHECK  Result Date: 04/01/2023 Pacemaker check in clinic. Normal device function. Thresholds, sensing, impedances consistent with previous measurements. Device programmed to maximize longevity. No mode switch or high ventricular rates noted. Device programmed at appropriate safety margins. Histogram distribution appropriate for  patient activity level. Device programmed to optimize intrinsic conduction. ERI as of 03/11/2023. Gen change scheduled today.   Microbiology: Results for orders placed or performed in visit on 05/03/19  Novel Coronavirus, NAA ([facility])     Status: None   Collection Time: 05/03/19 12:00 AM   Specimen: Nasopharyngeal(NP) swabs in vial transport medium   NASOPHARYNGE  TESTING  Result Value Ref Range Status   SARS-CoV-2, NAA Not Detected Not Detected Final    Comment: This nucleic acid amplification test was developed and its performance characteristics determined by [employer]. Nucleic acid amplification tests include PCR and TMA. This test has not been FDA cleared or approved. This test has been authorized by FDA under an Emergency Use Authorization (EUA). This test is only authorized for the duration of time the declaration that circumstances exist justifying the authorization of the emergency use of in vitro diagnostic tests for detection of SARS-CoV-2 virus and/or diagnosis of COVID-19 infection under section 564(b)(1) of the Act, 21 U.S.C. 409WJX-9(J) (1), unless the authorization is terminated or revoked sooner. When diagnostic testing is negative, the possibility of a false negative result should be considered in the context of a patient's recent exposures and the presence of clinical signs and symptoms consistent with COVID-19. An individual without symptoms of COVID-19 and who is not shedding SARS-CoV-2 virus would  expect to have a negative (not detected) result in this assay.     Labs: CBC: Recent Labs  Lab 04/28/23 1250  WBC 7.5  NEUTROABS 4.4  HGB 11.2*  HCT 34.0*  MCV 98.6  PLT 197   Basic Metabolic Panel: Recent Labs  Lab 04/28/23 1250 04/29/23 0221 04/30/23 0231  NA 137 139 138  K 4.0 3.9 3.5  CL 106 105 107  CO2 25 26 24   GLUCOSE 92 94 98  BUN 29* 27* 28*  CREATININE 1.28* 1.46* 1.27*  CALCIUM 8.6* 8.7* 8.4*   Liver Function  Tests: Recent Labs  Lab 04/28/23 1250  AST 25  ALT 15  ALKPHOS 56  BILITOT 1.3*  PROT  6.6  ALBUMIN 2.8*   CBG: No results for input(s): "GLUCAP" in the last 168 hours.  Discharge time spent: greater than 30 minutes.  Signed: Coralie Keens, MD [facility] 04/30/2023

## 2023-04-30 NOTE — Evaluation (Signed)
Physical Therapy Evaluation Patient Details Name: [name] MRN: [id number] DOB: Oct 24, 1924 Today's Date: 04/30/2023  History of Present Illness  89yo male who presented on 04/28/23 with c/o DOE and BLE edema, wt fluctuations. Had PPM generator change out on 04/15/23 and symptoms seemed to start shortly after this. Referred to ED at appt for PPM change out f/u due SPO2 in the 70s. Ultimately admitted with hypoxia and CHF exacerbation. PMH macular degeneration, osteoporosis, SSS s/p PPM, PPM generator change out 04/15/23   Clinical Impression  Pt received in bed, pleasant and cooperative, daughter present and observed session. Mobilized very well today, did have increased L foot scuffing/mild tripping due to limited ankle DF but able to correct with cues and did not lose balance. Left up in chair with all needs met, daughter present. Has PT available to him at [facility], would benefit from skilled services here following DC from the hospital.   Likely DC today, will defer further care to next venue and mobility team here in the hospital, thank you for the referral!       If plan is discharge home, recommend the following: Assistance with cooking/housework;Assist for transportation   Can travel by private vehicle        Equipment Recommendations Rolling walker (2 wheels)  Recommendations for Other Services       Functional Status Assessment Patient has had a recent decline in their functional status and demonstrates the ability to make significant improvements in function in a reasonable and predictable amount of time.     Precautions / Restrictions Precautions Precautions: ICD/Pacemaker Restrictions Weight Bearing Restrictions: No      Mobility  Bed Mobility Overal bed mobility: Independent                  Transfers Overall transfer level: Independent Equipment used: None                    Ambulation/Gait Ambulation/Gait assistance: Modified  independent (Device/Increase time) Gait Distance (Feet): 220 Feet Assistive device: Rolling walker (2 wheels) Gait Pattern/deviations: WFL(Within Functional Limits), Trunk flexed Gait velocity: WNL     General Gait Details: often catches toes of L foot on the floor but able to maintain balance, did well with cues to slow down and clear foot with swing phase  Stairs            Wheelchair Mobility     Tilt Bed    Modified Rankin (Stroke Patients Only)       Balance Overall balance assessment: Modified Independent                                           Pertinent Vitals/Pain Pain Assessment Pain Assessment: No/denies pain    Home Living Family/patient expects to be discharged to:: Other (Comment) (independent living)                   Additional Comments: at independent living at [place]    Prior Function Prior Level of Function : Independent/Modified Independent             Mobility Comments: used RW was independent with all mobility, going to gym in facility, not driving       Extremity/Trunk Assessment   Upper Extremity Assessment Upper Extremity Assessment: Defer to OT evaluation    Lower Extremity Assessment Lower Extremity Assessment: Overall WFL for  tasks assessed    Cervical / Trunk Assessment Cervical / Trunk Assessment: Kyphotic  Communication      Cognition Arousal: Alert Behavior During Therapy: WFL for tasks assessed/performed Overall Cognitive Status: Within Functional Limits for tasks assessed                                          General Comments      Exercises     Assessment/Plan    PT Assessment All further PT needs can be met in the next venue of care  PT Problem List Decreased mobility;Decreased strength       PT Treatment Interventions      PT Goals (Current goals can be found in the Care Plan section)  Acute Rehab PT Goals Patient Stated Goal: go home  today PT Goal Formulation: With patient/family Time For Goal Achievement: 05/14/23 Potential to Achieve Goals: Good    Frequency       Co-evaluation               AM-PAC PT "6 Clicks" Mobility  Outcome Measure Help needed turning from your back to your side while in a flat bed without using bedrails?: None Help needed moving from lying on your back to sitting on the side of a flat bed without using bedrails?: None Help needed moving to and from a bed to a chair (including a wheelchair)?: None Help needed standing up from a chair using your arms (e.g., wheelchair or bedside chair)?: None Help needed to walk in hospital room?: A Little Help needed climbing 3-5 steps with a railing? : A Little 6 Click Score: 22    End of Session   Activity Tolerance: Patient tolerated treatment well Patient left: in chair;with call bell/phone within reach Nurse Communication: Mobility status PT Visit Diagnosis: Difficulty in walking, not elsewhere classified (R26.2)    Time: 5643-3295 PT Time Calculation (min) (ACUTE ONLY): 15 min   Charges:   PT Evaluation $PT Eval Moderate Complexity: 1 Mod   PT General Charges $$ ACUTE PT VISIT: 1 Visit        Nedra Hai, PT, DPT 04/30/23 9:51 AM

## 2023-04-30 NOTE — TOC Transition Note (Signed)
Transition of Care [facility]) - CM/SW Discharge Note   Patient Details  Name: [name] MRN: [id number] Date of Birth: May 05, 1925  Transition of Care [facility]) CM/SW Contact:  Kermit Balo, RN Phone Number: 04/30/2023, 11:07 AM   Clinical Narrative:     Pt [place] apartment at [place]. Daughter to provide transportation. Outpatient therapy arranged with [facility] [employer]. Orders faxed: [phone] [facility] RN orders sent to [facility] with [facility] at: [phone] [facility] for home ordered through [employer] and will be delivered to the room. Daughter prefers to get shower seat outside the hospital setting as his insurance doesn't cover this DME.  Final next level of care: Home w Home Health Services Barriers to Discharge: No Barriers Identified   Patient Goals and CMS Choice CMS Medicare.gov Compare Post Acute Care list provided to:: Patient Choice offered to / list presented to : Patient, Adult Children  Discharge Placement                         Discharge Plan and Services Additional resources added to the After Visit Summary for                            HH Arranged: RN, PT, OT HH Agency:  International aid/development worker) Date [facility] Agency Contacted: 04/30/23   Representative spoke with at [facility] Agency: Rene Kocher and [employer] [phone]  Social Determinants of Health (SDOH) Interventions SDOH Screenings   Food Insecurity: No Food Insecurity (04/29/2023)  Housing: Patient Declined (04/29/2023)  Transportation Needs: No Transportation Needs (04/29/2023)  Utilities: Not At Risk (04/29/2023)  Tobacco Use: Low Risk  (04/30/2023)     Readmission Risk Interventions     No data to display

## 2023-05-03 NOTE — Progress Notes (Unsigned)
Cardiology Office Note:  .   Date:  05/04/2023  ID:  [name], DOB 1925-03-06, MRN [id number] PCP: Rodrigo Ran, MD  [facility] Providers Cardiologist: Olga Millers, MD  Electrophysiologist:  Maurice Small, MD }   }   History of Present Illness: .   [name] is a 89 y.o. male past medical history of atrial fibrillation, and bradycardia sp pacemaker, chronic kidney disease stage IIIa we are seeing post-hospitalization where he was admitted with diastolic heart failure, hypertension, and lower extremity edema.  He was admitted from 04/28/2023 through 04/30/2023.  He was given IV diuresis, ordered TED hose, and provided with outpatient OT, PT, and a home health nurse to monitor his weight, blood pressure, and medications.  He is due to have a BMET on this office visit.  He is here today accompanied by his daughter and has been feeling well.  He denies any worsening of symptoms to include shortness of breath, chest pain or palpitations.  Lower extremity edema is vastly improved.  He continues to wear support hose.  He denies any dyspnea on exertion or chest pain.  He is tolerating spironolactone and Lasix and continues to weigh himself daily.  His dry weight is 156 pounds at home.  He lives at a skilled nursing assisted living and is not restricting salt as he is eating what is being served.  ROS: As above otherwise negative.  Studies Reviewed: .   Echocardiogram 04/28/2023  1. Left ventricular ejection fraction, by estimation, is 55 to 60%. The  left ventricle has normal function. The left ventricle has no regional  wall motion abnormalities. There is mild concentric left ventricular  hypertrophy. Left ventricular diastolic  function could not be evaluated.   2. Right ventricular systolic function is mildly reduced. The right  ventricular size is mildly enlarged. There is moderately elevated  pulmonary artery systolic pressure. The estimated right ventricular   systolic pressure is 48.4 mmHg.   3. Left atrial size was severely dilated.   4. Right atrial size was severely dilated.   5. The mitral valve is normal in structure. Mild mitral valve  regurgitation. No evidence of mitral stenosis.   6. The aortic valve is tricuspid. There is mild calcification of the  aortic valve. There is mild thickening of the aortic valve. Aortic valve  regurgitation is trivial.   7. The inferior vena cava is normal in size with <50% respiratory  variability, suggesting right atrial pressure of 8 mmHg.    EKG Interpretation Date/Time:  Tuesday May 04 2023 15:32:48 EST Ventricular Rate:  67 PR Interval:    QRS Duration:  156 QT Interval:  448 QTC Calculation: 473 R Axis:   215  Text Interpretation: Ventricular-paced rhythm with occasional Premature ventricular complexes When compared with ECG of 28-Apr-2023 11:04, PREVIOUS ECG IS PRESENT Confirmed by Joni Reining [phone]) on 05/04/2023 3:49:47 PM    Physical Exam:   VS:  BP 110/70 (BP Location: Right Arm, Patient Position: Sitting, Cuff Size: Normal)   Pulse 67   Ht 5\' 8"  (1.727 m)   Wt 159 lb 9.6 oz (72.4 kg)   SpO2 93%   BMI 24.27 kg/m    Wt Readings from Last 3 Encounters:  05/04/23 159 lb 9.6 oz (72.4 kg)  04/30/23 157 lb (71.2 kg)  04/15/23 168 lb (76.2 kg)    GEN: Well nourished, well developed in no acute distress NECK: No JVD; No carotid bruits CARDIAC: RRR, occasional extrasystole, soft systolic  murmur heard best at the left sternal border, no  rubs, gallops RESPIRATORY:  Clear to auscultation without rales, wheezing or rhonchi  ABDOMEN: Soft, non-tender, non-distended EXTREMITIES:  No edema; No deformity   ASSESSMENT AND PLAN: .    Chronic combined CHF: Recent hospitalization for same.  Was given IV diuresis and dry weight is 156 pounds now.  He is continued on spironolactone and Lasix as directed.  I am going to order a BMET today to evaluate for kidney function.  He offers no  complaints of volume overload, significant dyspnea on exertion, PND orthopnea.  Continue current medication regimen as directed.  He is given refills on spironolactone and Lasix.  2.  Atrial fibrillation: Remains on apixaban 5 mg twice daily.  I have calculated Eliquis dose and this is appropriate.  No evidence of bleeding.  Continue current medication regimen as directed.  3.  Hypertension: Blood pressures currently well-controlled.  He is not having any issues with dizziness or near syncope.  No changes in his regimen.  4.  Pacemaker in situ: Placed in the setting of bradycardia.  Followed by Dr. Nelly Laurence He is due to see him again in February 2025, he is to continue remote pacemaker checks.         Signed, Bettey Mare. Liborio Nixon, ANP, AACC

## 2023-05-04 ENCOUNTER — Ambulatory Visit: Payer: Medicare Other | Attending: Adult Health | Admitting: Adult Health

## 2023-05-04 ENCOUNTER — Encounter: Payer: Self-pay | Admitting: Adult Health

## 2023-05-04 VITALS — BP 110/70 | HR 67 | Ht 68.0 in | Wt 159.6 lb

## 2023-05-04 DIAGNOSIS — I10 Essential (primary) hypertension: Secondary | ICD-10-CM

## 2023-05-04 DIAGNOSIS — I4811 Longstanding persistent atrial fibrillation: Secondary | ICD-10-CM | POA: Diagnosis not present

## 2023-05-04 DIAGNOSIS — I442 Atrioventricular block, complete: Secondary | ICD-10-CM | POA: Diagnosis not present

## 2023-05-04 DIAGNOSIS — I5042 Chronic combined systolic (congestive) and diastolic (congestive) heart failure: Secondary | ICD-10-CM | POA: Diagnosis not present

## 2023-05-04 DIAGNOSIS — Z79899 Other long term (current) drug therapy: Secondary | ICD-10-CM

## 2023-05-04 MED ORDER — FUROSEMIDE 20 MG PO TABS: 10.0000 mg | ORAL_TABLET | Freq: Every day | ORAL | 1 refills | Status: DC

## 2023-05-04 MED ORDER — SPIRONOLACTONE 25 MG PO TABS: 12.5000 mg | ORAL_TABLET | Freq: Every day | ORAL | 1 refills | Status: DC

## 2023-05-04 NOTE — Patient Instructions (Signed)
Medication Instructions:  No changes *If you need a refill on your cardiac medications before your next appointment, please call your pharmacy*   Lab Work: BMET   Today If you have labs (blood work) drawn today and your tests are completely normal, you will receive your results only by: MyChart Message (if you have MyChart) OR A paper copy in the mail If you have any lab test that is abnormal or we need to change your treatment, we will call you to review the results.   Testing/Procedures: No Testing   Follow-Up: At [facility], you and your health needs are our priority.  As part of our continuing mission to provide you with exceptional heart care, we have created designated Provider Care Teams.  These Care Teams include your primary Cardiologist (physician) and Advanced Practice Providers (APPs -  Physician Assistants and Nurse Practitioners) who all work together to provide you with the care you need, when you need it.  We recommend signing up for the patient portal called "MyChart".  Sign up information is provided on this After Visit Summary.  MyChart is used to connect with patients for Virtual Visits (Telemedicine).  Patients are able to view lab/test results, encounter notes, upcoming appointments, etc.  Non-urgent messages can be sent to your provider as well.   To learn more about what you can do with MyChart, go to [email or web address].    Your next appointment:   3-4 month(s)  Provider:   Olga Millers, MD

## 2023-05-05 LAB — BASIC METABOLIC PANEL
BUN/Creatinine Ratio: 22 (ref 10–24)
BUN: 30 mg/dL (ref 10–36)
CO2: 21 mmol/L (ref 20–29)
Calcium: 9.6 mg/dL (ref 8.6–10.2)
Chloride: 103 mmol/L (ref 96–106)
Creatinine, Ser: 1.36 mg/dL — ABNORMAL HIGH (ref 0.76–1.27)
Glucose: 116 mg/dL — ABNORMAL HIGH (ref 70–99)
Potassium: 4.6 mmol/L (ref 3.5–5.2)
Sodium: 142 mmol/L (ref 134–144)
eGFR: 48 mL/min/{1.73_m2} — ABNORMAL LOW (ref >59)

## 2023-05-11 ENCOUNTER — Ambulatory Visit: Payer: Medicare Other | Admitting: Cardiology

## 2023-05-12 ENCOUNTER — Telehealth: Payer: Self-pay | Admitting: Adult Health

## 2023-05-12 MED ORDER — SPIRONOLACTONE 25 MG PO TABS: 25.0000 mg | ORAL_TABLET | Freq: Every day | ORAL | 1 refills | Status: DC

## 2023-05-12 NOTE — Telephone Encounter (Signed)
Maurice Gross, NP  You1 minute ago (9:33 AM)   Yes a new Rx for 25 mg of spironolactone can be sent.  KL

## 2023-05-12 NOTE — Telephone Encounter (Signed)
Maurice Gross, NP  You1 hour ago (12:59 PM)    He should be on lasix 10 mg daily.  If his weight is above 156 lbs he is to take 20 mg that day only.  Not sure how she got confused on the dosing.  KL   You  [name], NP2 hours ago (12:44 PM)    They have been taking a whole tablet of the Lasix and not the 0.5 tablet that was prescribed. Would you like to also send a updated prescription to reflect him taking lasix 20mg  daily? At this time they are unable to refill the Lasix because refill is to soon. If you would like him to continue with original direction of 0.5 tablet (10mg  lasix) they will have to pay out of pocket.   Maurice Gross, NP  You4 hours ago (10:37 AM)    Please have him take the medications as directed. His baseline weight should be 156 lbs.  IF he is hypotensive, dizzy or weighing less than 156 lbs let us know. We are trying to keep him at his lowest weight to prevent volume overload. He is eating what is served him at [facility] and they are not restricting salt in his diet. IF he is symptomatic he will need to be seen in person.  KL   You  [name], NP5 hours ago (9:45 AM)    Hi NP [name] Patients daughter states she was giving him 20mg  lasix daily as well and not the half tablet. She would like to confirm if it okay to continue with spironolactone 25mg  daily and how much of the lasix they should continue giving Thank yo

## 2023-05-12 NOTE — Telephone Encounter (Signed)
Patient identification verified by 2 forms. Marilynn Rail, RN    Called and spoke to patients daughter Dolores Patty states:   -patient has been urinating a lot with taking spironolactone 25mg  daily   -she has also been giving him Lasix 20mg  and not half a tablet   -is concerned about continuing with spironolactone 25mg  daily   -would like instruction on what to do with both prescriptions moving forward  Informed susan message sent to provider for assistance

## 2023-05-12 NOTE — Telephone Encounter (Signed)
Patient identification verified by 2 forms. Marilynn Rail, RN    Called and spoke to patients daughter Arnette Schaumann provider message  Informed Darl Pikes:   -continue with spironolactone 25 daily   -continue with 10 mg (0.5 tablet) lasix daily   -may need to pay out of pocket cost for lasix for future refill (per pharmacy ~12.39) Darl Pikes verbalized understanding, no questions at this time

## 2023-05-12 NOTE — Telephone Encounter (Signed)
Pt c/o medication issue:  1. Name of Medication:       spironolactone (ALDACTONE) 25 MG tablet Take 0.5 tablets (12.5 mg total) by mouth daily.    2. How are you currently taking this medication (dosage and times per day)? No   3. Are you having a reaction (difficulty breathing--STAT)? No   4. What is your medication issue? Pt daughter states [facility] cannot fill spironolactone  until 12/22 but he will not have enough until then. I informed her the rx says 0.5 tablet daily but she has been giving him a whole tablet daily. Please advise.

## 2023-05-12 NOTE — Telephone Encounter (Signed)
Please see 12/11 TE for documentation

## 2023-05-13 ENCOUNTER — Telehealth: Payer: Self-pay

## 2023-05-13 NOTE — Telephone Encounter (Addendum)
-  Called patient regarding results. Spoke with patients daughter patients daughter had understanding of results. Patients daughter aware to repeat BMET in 1 month.---- Message from Joni Reining sent at 05/05/2023  5:29 PM EST ----- I have reviewed the labs. Some strain on the kidney function with lasix and spironolactone compared to previous labs.  Would continue his regimen for now. Potassium is normal and he does not need supplement of potassium. Repeat BMET in one month KL

## 2023-05-21 ENCOUNTER — Telehealth: Payer: Self-pay

## 2023-05-21 NOTE — Telephone Encounter (Addendum)
Results viewed by patient via Mychart----- Message from Joni Reining sent at 05/05/2023  5:29 PM EST ----- I have reviewed the labs. Some strain on the kidney function with lasix and spironolactone compared to previous labs.  Would continue his regimen for now. Potassium is normal and he does not need supplement of potassium. Repeat BMET in one month KL

## 2023-06-03 ENCOUNTER — Telehealth: Payer: Self-pay | Admitting: Cardiology

## 2023-06-03 DIAGNOSIS — I10 Essential (primary) hypertension: Secondary | ICD-10-CM

## 2023-06-03 NOTE — Telephone Encounter (Signed)
 [facility] is calling stating patient is there to get labs done, but they do not have orders. Requesting orders be made.

## 2023-06-03 NOTE — Telephone Encounter (Signed)
 Result Note I have reviewed the labs. Some strain on the kidney function with lasix  and spironolactone  compared to previous labs.  Would continue his regimen for now. Potassium is normal and he does not need supplement of potassium. Repeat BMET in one month KL    Lab orders placed. Lab tech states that she has received them prior to hanging up.

## 2023-06-04 ENCOUNTER — Telehealth: Payer: Self-pay

## 2023-06-04 DIAGNOSIS — I10 Essential (primary) hypertension: Secondary | ICD-10-CM

## 2023-06-04 LAB — BASIC METABOLIC PANEL
BUN/Creatinine Ratio: 19 (ref 10–24)
BUN: 31 mg/dL (ref 10–36)
CO2: 23 mmol/L (ref 20–29)
Calcium: 9.1 mg/dL (ref 8.6–10.2)
Chloride: 107 mmol/L — ABNORMAL HIGH (ref 96–106)
Creatinine, Ser: 1.66 mg/dL — ABNORMAL HIGH (ref 0.76–1.27)
Glucose: 98 mg/dL (ref 70–99)
Potassium: 4.5 mmol/L (ref 3.5–5.2)
Sodium: 142 mmol/L (ref 134–144)
eGFR: 37 mL/min/{1.73_m2} — ABNORMAL LOW (ref >59)

## 2023-06-04 NOTE — Telephone Encounter (Addendum)
-  Called patient regarding results. Spoke with patients daughter . Results viewed via My Chart . Called to follow up to verify understanding of results. Patients daughter had understanding of results. And stated no new Prescription needed.---- Message from Lamarr Satterfield sent at 06/04/2023  7:43 AM EST ----- I have reviewed the labs. Renal function has worsened. Please decrease the spironolactone  to 12.5 mg daily from 25 mg daily.

## 2023-07-05 ENCOUNTER — Telehealth: Payer: Self-pay | Admitting: Adult Health

## 2023-07-05 DIAGNOSIS — I10 Essential (primary) hypertension: Secondary | ICD-10-CM

## 2023-07-05 NOTE — Telephone Encounter (Signed)
Called and spoke to Darl Pikes patient's daughter. Verified patient's name and DOB. Informed her labs had been released.

## 2023-07-05 NOTE — Telephone Encounter (Signed)
Daughter is calling because she is taking her dad to have labs done on Friday morning. She wants to make sure they were released before they got there.

## 2023-07-10 LAB — BASIC METABOLIC PANEL
BUN/Creatinine Ratio: 19 (ref 10–24)
BUN: 27 mg/dL (ref 10–36)
CO2: 22 mmol/L (ref 20–29)
Calcium: 9.1 mg/dL (ref 8.6–10.2)
Chloride: 104 mmol/L (ref 96–106)
Creatinine, Ser: 1.44 mg/dL — ABNORMAL HIGH (ref 0.76–1.27)
Glucose: 77 mg/dL (ref 70–99)
Potassium: 4.2 mmol/L (ref 3.5–5.2)
Sodium: 141 mmol/L (ref 134–144)
eGFR: 44 mL/min/{1.73_m2} — ABNORMAL LOW (ref >59)

## 2023-07-12 ENCOUNTER — Telehealth: Payer: Self-pay

## 2023-07-12 ENCOUNTER — Ambulatory Visit: Payer: Medicare Other

## 2023-07-12 NOTE — Telephone Encounter (Addendum)
 Results viewed by patient via MyChart.----- Message from Friddie Jetty sent at 07/11/2023  1:30 PM EST ----- I have reviewed the labs. Kidney function is stable. Somewhat improved.  Potassium is normal on the spironolactone .  No concerns here.

## 2023-07-13 ENCOUNTER — Telehealth: Payer: Self-pay

## 2023-07-13 NOTE — Telephone Encounter (Addendum)
Results viewed by patient via My chart.---- Message from Joni Reining sent at 07/11/2023  1:30 PM EST ----- I have reviewed the labs. Kidney function is stable. Somewhat improved.  Potassium is normal on the spironolactone.  No concerns here.

## 2023-07-15 ENCOUNTER — Ambulatory Visit (INDEPENDENT_AMBULATORY_CARE_PROVIDER_SITE_OTHER): Payer: Medicare Other

## 2023-07-15 DIAGNOSIS — I442 Atrioventricular block, complete: Secondary | ICD-10-CM

## 2023-07-17 LAB — CUP PACEART REMOTE DEVICE CHECK
Battery Remaining Longevity: 149 mo
Battery Voltage: 3.21 V
Brady Statistic AP VP Percent: 0 %
Brady Statistic AP VS Percent: 0 %
Brady Statistic AS VP Percent: 97.99 %
Brady Statistic AS VS Percent: 2.01 %
Brady Statistic RA Percent Paced: 0 %
Brady Statistic RV Percent Paced: 97.99 %
Date Time Interrogation Session: 20250212221820
Implantable Lead Connection Status: 753985
Implantable Lead Connection Status: 753985
Implantable Lead Implant Date: 20160724
Implantable Lead Implant Date: 20160724
Implantable Lead Location: 753859
Implantable Lead Location: 753860
Implantable Lead Model: 5076
Implantable Lead Model: 5076
Implantable Pulse Generator Implant Date: 20241114
Lead Channel Impedance Value: 266 Ohm
Lead Channel Impedance Value: 323 Ohm
Lead Channel Impedance Value: 342 Ohm
Lead Channel Impedance Value: 380 Ohm
Lead Channel Pacing Threshold Amplitude: 0.625 V
Lead Channel Pacing Threshold Pulse Width: 0.4 ms
Lead Channel Sensing Intrinsic Amplitude: 0.75 mV
Lead Channel Sensing Intrinsic Amplitude: 7.375 mV
Lead Channel Sensing Intrinsic Amplitude: 7.375 mV
Lead Channel Setting Pacing Amplitude: 2 V
Lead Channel Setting Pacing Pulse Width: 0.4 ms
Lead Channel Setting Sensing Sensitivity: 2.8 mV
Zone Setting Status: 755011

## 2023-07-23 ENCOUNTER — Ambulatory Visit: Payer: Medicare Other | Admitting: Cardiovascular Disease

## 2023-07-29 ENCOUNTER — Encounter: Payer: Self-pay | Admitting: Cardiovascular Disease

## 2023-07-30 ENCOUNTER — Ambulatory Visit: Payer: Medicare Other | Attending: Cardiovascular Disease | Admitting: Cardiovascular Disease

## 2023-07-30 ENCOUNTER — Encounter: Payer: Self-pay | Admitting: Cardiovascular Disease

## 2023-07-30 VITALS — BP 118/68 | HR 62 | Ht 68.0 in | Wt 165.8 lb

## 2023-07-30 DIAGNOSIS — I442 Atrioventricular block, complete: Secondary | ICD-10-CM | POA: Diagnosis not present

## 2023-07-30 DIAGNOSIS — I4819 Other persistent atrial fibrillation: Secondary | ICD-10-CM | POA: Diagnosis not present

## 2023-07-30 DIAGNOSIS — I495 Sick sinus syndrome: Secondary | ICD-10-CM | POA: Diagnosis not present

## 2023-07-30 NOTE — Patient Instructions (Signed)
 Medication Instructions:  Your physician recommends that you continue on your current medications as directed. Please refer to the Current Medication list given to you today. *If you need a refill on your cardiac medications before your next appointment, please call your pharmacy*   Follow-Up: At [facility], you and your health needs are our priority.  As part of our continuing mission to provide you with exceptional heart care, we have created designated Provider Care Teams.  These Care Teams include your primary Cardiologist (physician) and Advanced Practice Providers (APPs -  Physician Assistants and Nurse Practitioners) who all work together to provide you with the care you need, when you need it.  We recommend signing up for the patient portal called "MyChart".  Sign up information is provided on this After Visit Summary.  MyChart is used to connect with patients for Virtual Visits (Telemedicine).  Patients are able to view lab/test results, encounter notes, upcoming appointments, etc.  Non-urgent messages can be sent to your provider as well.   To learn more about what you can do with MyChart, go to [email or web address].    Your next appointment:   1 year(s)  Provider:   York Pellant, MD

## 2023-07-30 NOTE — Progress Notes (Signed)
    Electrophysiology Office Note Date: 07/30/2023  ID:  Maurice, Powell 02/07/1925, MRN [id number]  PCP: Rodrigo Ran, MD Primary Cardiologist: Olga Millers, MD Electrophysiologist: Maurice Small, MD   CC: Pacemaker follow-up  [name] is a 89 y.o. male seen today for Maurice Small, MD for routine electrophysiology followup.  Since last being seen in our clinic the patient reports doing OK. He is here today to ask about SOB. He says it has been pretty stable over the past few years. His daughter notices he takes a golf cart to the mail box now instead of walking. He thinks he's had a gradual decline since COVID. He has mild SOB with inclines or stairs. He can "mess around" in the yard for about 10-15 minutes prior to needing to sit and rest for a few minutes. He is able to carry on conversations. He has mild peripheral edema in the EVENINGs, but it is gone by morning.   No chest pain, syncope, lightheadedness or dizziness. Easy bruising on eliquis but no overt bleeding. he has no device related complaints -- no new tenderness, drainage, redness.   Device History: MDT dual chamber PPM implanted 2016 for SSS   Physical Exam: There were no vitals filed for this visit.    Gen: Appears comfortable, well-nourished CV: RRR, no dependent edema The device site is normal -- no tenderness, edema, drainage, redness, threatened erosion.  Pulm: breathing easily  PPM Interrogation- reviewed in detail today,  See PACEART report  EKG:  No ECG today.  Recent Labs: 04/28/2023: ALT 15; B Natriuretic Peptide 353.2; Hemoglobin 11.2; Platelets 197 07/09/2023: BUN 27; Creatinine, Ser 1.44; Potassium 4.2; Sodium 141   Wt Readings from Last 3 Encounters:  05/04/23 159 lb 9.6 oz (72.4 kg)  04/30/23 157 lb (71.2 kg)  04/15/23 168 lb (76.2 kg)     Other studies Reviewed: Additional studies/ records that were reviewed today include: Previous EP office notes, Previous remote checks,  Most recent labwork.   Assessment and Plan:  1. Symptomatic bradycardia s/p Medtronic PPM  Normal PPM function See Arita Miss Art report No changes today Programmed VVIR as of 06/2020  2. Longstanding persistent atrial fibrillation Rates controlled Continue eliquis for CHA2DS2/VASc of at least 2.    3. Mild DOE Has mild SOB with inclines, stable for years.  He is overall able to do his ADLs without difficulty    Current medicines are reviewed at length with the patient today.    Labs/ tests ordered today include:  Orders Placed This Encounter  Procedures   EKG 12-Lead   Disposition:   Follow up with EP APP in 12 months, sooner with issues.    Signed, Maurice Small, MD  07/30/2023 1:45 PM  [facility] [address] [place] [address] [phone] (office) [phone] (fax)

## 2023-08-17 NOTE — Addendum Note (Signed)
 Addended by: Elease Etienne A on: 08/17/2023 12:46 PM   Modules accepted: Orders

## 2023-08-17 NOTE — Progress Notes (Signed)
 Remote pacemaker transmission.

## 2023-09-20 ENCOUNTER — Ambulatory Visit: Payer: Medicare Other | Admitting: Adult Health

## 2023-09-22 ENCOUNTER — Ambulatory Visit: Attending: Emergency Medicine | Admitting: Emergency Medicine

## 2023-09-22 ENCOUNTER — Encounter: Payer: Self-pay | Admitting: Emergency Medicine

## 2023-09-22 VITALS — BP 108/62 | HR 75 | Ht 70.0 in | Wt 164.8 lb

## 2023-09-22 DIAGNOSIS — I442 Atrioventricular block, complete: Secondary | ICD-10-CM | POA: Diagnosis not present

## 2023-09-22 DIAGNOSIS — I10 Essential (primary) hypertension: Secondary | ICD-10-CM

## 2023-09-22 DIAGNOSIS — I4811 Longstanding persistent atrial fibrillation: Secondary | ICD-10-CM | POA: Diagnosis not present

## 2023-09-22 DIAGNOSIS — Z950 Presence of cardiac pacemaker: Secondary | ICD-10-CM

## 2023-09-22 DIAGNOSIS — I5032 Chronic diastolic (congestive) heart failure: Secondary | ICD-10-CM | POA: Diagnosis not present

## 2023-09-22 NOTE — Progress Notes (Signed)
 Cardiology Office Note:    Date:  09/22/2023  ID:  [name], DOB 1924/12/12, MRN [id number] PCP: Maurice Hun, MD  [facility] Providers Cardiologist:  Maurice Angel, MD Electrophysiologist:  Maurice Grange, MD       Patient Profile:      Chief Complaint: 89-month follow-up for diastolic heart failure History of Present Illness:  [name] is a 89 y.o. male with visit-pertinent history of symptomatic bradycardia s/p Medtronic PPM, sick sinus syndrome, longstanding persistent atrial fibrillation, chronic kidney disease stage III, diastolic heart failure, hypertension, lower extremity edema  He saw his PCP on 04/27/2023 with shortness of breath and lower extremity swelling.  Chest x-ray was concerning for changes consistent with CHF.  He had no history of prior echocardiogram.  He did not carry history of systolic heart failure.  He was seen in our device clinic on 04/28/2023 for scheduled wound/device check.  He was hypoxic at 84-89% on room air.  He was observed to have wheezing, shortness of breath, bilateral lower extremity swelling.  He was sent to the ED for further evaluation.  BNP was only 353.  He was admitted from 04/28/2023 through 04/30/2023 for acute on diastolic heart failure exacerbation.  Echocardiogram 04/28/2023 showed LVEF 55-60%, no RWMA, mild concentric LVH, RV systolic function mildly reduced, RV size mildly enlarged, moderately elevated PASP at 48.4, biatrial size severely dilated, mild mitral valve regurgitation, trivial aortic valve regurgitation.  He was given IV diuresis, negative fluid balance was achieved with significant treatment of symptoms, patient will total of 3 EKG during hospitalization.  He was provided with outpatient OT, PT, and home health.  Last seen in clinic on 07/30/2023 with EP Dr. Arlester Powell.  He noted mild shortness of breath with inclines has been stable for years.  He is overall able to do his ADLs without any difficulty.  No  changes were made to his medical regimen.  He was to follow-up with the EP in 1 year.  Discussed the use of AI scribe software for clinical note transcription with the patient, who gave verbal consent to proceed.  History of Present Illness The patient, a 89 year old with a history of heart failure and atrial fibrillation, presents for a follow-up visit. He was previously admitted to the hospital in November for diastolic heart failure and has been managed with Lasix  and spironolactone . The patient reports feeling "pretty good" for his age and continues to live independently and exercise regularly. He has noticed some leg swelling, which he manages with compression socks. The swelling tends to increase during the day and decrease overnight. The patient has not noticed any significant weight gain.[name] He continues to monitor his weight and fluid intake closely. The patient's pacemaker was last checked in February and was functioning normally.  He denies chest pain, shortness of breath, fatigue, palpitations, melena, hematuria, hemoptysis, diaphoresis, weakness, presyncope, syncope, orthopnea, and PND.  Review of systems:  Please see the history of present illness. All other systems are reviewed and otherwise negative.     Home Medications:    Current Meds  Medication Sig   acetaminophen  (TYLENOL ) 500 MG tablet Take 500-1,000 mg by mouth every 6 (six) hours as needed for moderate pain (pain score 4-6).   apixaban  (ELIQUIS ) 5 MG TABS tablet Take 1 tablet (5 mg total) by mouth 2 (two) times daily.   Ascorbic Acid (VITAMIN C PO) Take 1 tablet by mouth daily.   cholecalciferol (VITAMIN D) 1000 UNITS tablet Take 1,000 Units by  mouth daily.   Cyanocobalamin (B-12 PO) Take 1 tablet by mouth daily.   ferrous sulfate 325 (65 FE) MG tablet Take 325 mg by mouth daily with breakfast.   furosemide  (LASIX ) 20 MG tablet Take 0.5 tablets (10 mg total) by mouth daily.   traMADol  (ULTRAM ) 50 MG tablet Take 50 mg by  mouth every 8 (eight) hours as needed for moderate pain (pain score 4-6).   zolpidem  (AMBIEN ) 5 MG tablet Take 5 mg by mouth at bedtime.   Studies Reviewed:       Echocardiogram 04/28/2023 1. Left ventricular ejection fraction, by estimation, is 55 to 60%. The  left ventricle has normal function. The left ventricle has no regional  wall motion abnormalities. There is mild concentric left ventricular  hypertrophy. Left ventricular diastolic  function could not be evaluated.   2. Right ventricular systolic function is mildly reduced. The right  ventricular size is mildly enlarged. There is moderately elevated  pulmonary artery systolic pressure. The estimated right ventricular  systolic pressure is 48.4 mmHg.   3. Left atrial size was severely dilated.   4. Right atrial size was severely dilated.   5. The mitral valve is normal in structure. Mild mitral valve  regurgitation. No evidence of mitral stenosis.   6. The aortic valve is tricuspid. There is mild calcification of the  aortic valve. There is mild thickening of the aortic valve. Aortic valve  regurgitation is trivial.   7. The inferior vena cava is normal in size with <50% respiratory  variability, suggesting right atrial pressure of 8 mmHg.  Risk Assessment/Calculations:             Physical Exam:   VS:  BP 108/62 (BP Location: Left Arm, Patient Position: Sitting, Cuff Size: Normal)   Pulse 75   Ht 5\' 10"  (1.778 m)   Wt 164 lb 12.8 oz (74.8 kg)   SpO2 95%   BMI 23.65 kg/m    Wt Readings from Last 3 Encounters:  09/22/23 164 lb 12.8 oz (74.8 kg)  07/30/23 165 lb 12.8 oz (75.2 kg)  05/04/23 159 lb 9.6 oz (72.4 kg)    GEN: Well nourished, well developed in no acute distress NECK: No JVD; No carotid bruits CARDIAC: RRR, no murmurs, rubs, gallops RESPIRATORY:  Clear to auscultation without rales, wheezing or rhonchi  ABDOMEN: Soft, non-tender, non-distended EXTREMITIES:  No edema; No acute deformity     Assessment and  Plan:  Chronic diastolic heart failure Echocardiogram 04/2023 with LVEF 55-60%, no RWMA, mild LVH, RV mildly reduced and mildly enlarged, PASP 48.4 - Today patient is euvolemic and well compensated on exam.  NYHA class II.  He continues to exercise regularly without anginal symptoms - Continue Lasix  10 mg daily and spironolactone  12.5 mg daily - Continue with compression stockings, leg elevation, daily exercise, and daily weights - BMET and CBC today  Longstanding persistent atrial fibrillation Continues to be adequately rate controlled - Continue Eliquis  5 mg twice daily, no bleeding concerns, appropriately dosed (age 71, 74.8 kg, creatinine 1.44) - Managed by EP  Symptomatic CHB s/p Medtronic PPM - Recent generator change 04/15/2023 - Recent Pace Art report reviewed with normal PPM function - Managed by EP  Hypertension Blood pressure today is 108/62 and under adequate control - No dizziness, lightheadedness, syncope, near syncope, falls - Continue current antihypertensive regimen     Dispo:  Return in about 6 months (around 03/23/2024).  Signed, Ava Boatman, NP

## 2023-09-22 NOTE — Patient Instructions (Signed)
 Medication Instructions:  NO CHANGES   Lab Work: BMET AND CBC TO BE DONE TODAY   Testing/Procedures: NONE  Follow-Up: At [facility], you and your health needs are our priority.  As part of our continuing mission to provide you with exceptional heart care, our providers are all part of one team.  This team includes your primary Cardiologist (physician) and Advanced Practice Providers or APPs (Physician Assistants and Nurse Practitioners) who all work together to provide you with the care you need, when you need it.  Your next appointment:   6 month(s)  Provider:   Alexandria Angel, MD  OR Palmer Bobo, DNP   We recommend signing up for the patient portal called "MyChart".  Sign up information is provided on this After Visit Summary.  MyChart is used to connect with patients for Virtual Visits (Telemedicine).  Patients are able to view lab/test results, encounter notes, upcoming appointments, etc.  Non-urgent messages can be sent to your provider as well.   To learn more about what you can do with MyChart, go to [email or web address].   Other Instructions:    1st Floor: - Lobby - Registration  - Pharmacy  - Lab - Cafe  2nd Floor: - PV Lab - Diagnostic Testing (echo, CT, nuclear med)  3rd Floor: - Vacant  4th Floor: - TCTS (cardiothoracic surgery) - AFib Clinic - Structural Heart Clinic - Vascular Surgery  - Vascular Ultrasound  5th Floor: - HeartCare Cardiology (general and EP) - Clinical Pharmacy for coumadin, hypertension, lipid, weight-loss medications, and med management appointments    Valet parking services will be available as well.

## 2023-09-23 LAB — CBC
Hematocrit: 37.6 % (ref 37.5–51.0)
Hemoglobin: 12.8 g/dL — ABNORMAL LOW (ref 13.0–17.7)
MCH: 32.6 pg (ref 26.6–33.0)
MCHC: 34 g/dL (ref 31.5–35.7)
MCV: 96 fL (ref 79–97)
Platelets: 217 x10E3/uL (ref 150–450)
RBC: 3.93 x10E6/uL — ABNORMAL LOW (ref 4.14–5.80)
RDW: 12.1 % (ref 11.6–15.4)
WBC: 12.4 x10E3/uL — ABNORMAL HIGH (ref 3.4–10.8)

## 2023-09-23 LAB — BASIC METABOLIC PANEL WITH GFR
BUN/Creatinine Ratio: 17 (ref 10–24)
BUN: 25 mg/dL (ref 10–36)
CO2: 23 mmol/L (ref 20–29)
Calcium: 9.5 mg/dL (ref 8.6–10.2)
Chloride: 102 mmol/L (ref 96–106)
Creatinine, Ser: 1.44 mg/dL — ABNORMAL HIGH (ref 0.76–1.27)
Glucose: 83 mg/dL (ref 70–99)
Potassium: 5 mmol/L (ref 3.5–5.2)
Sodium: 138 mmol/L (ref 134–144)
eGFR: 44 mL/min/{1.73_m2} — ABNORMAL LOW (ref >59)

## 2023-10-11 ENCOUNTER — Ambulatory Visit: Payer: Medicare Other

## 2023-10-14 ENCOUNTER — Ambulatory Visit (INDEPENDENT_AMBULATORY_CARE_PROVIDER_SITE_OTHER): Payer: Medicare Other

## 2023-10-14 DIAGNOSIS — I442 Atrioventricular block, complete: Secondary | ICD-10-CM | POA: Diagnosis not present

## 2023-10-14 LAB — CUP PACEART REMOTE DEVICE CHECK
Battery Remaining Longevity: 146 mo
Battery Voltage: 3.19 V
Brady Statistic AP VP Percent: 0 %
Brady Statistic AP VS Percent: 0 %
Brady Statistic AS VP Percent: 99.06 %
Brady Statistic AS VS Percent: 0.94 %
Brady Statistic RA Percent Paced: 0 %
Brady Statistic RV Percent Paced: 99.06 %
Date Time Interrogation Session: 20250515012857
Implantable Lead Connection Status: 753985
Implantable Lead Connection Status: 753985
Implantable Lead Implant Date: 20160724
Implantable Lead Implant Date: 20160724
Implantable Lead Location: 753859
Implantable Lead Location: 753860
Implantable Lead Model: 5076
Implantable Lead Model: 5076
Implantable Pulse Generator Implant Date: 20241114
Lead Channel Impedance Value: 285 Ohm
Lead Channel Impedance Value: 323 Ohm
Lead Channel Impedance Value: 342 Ohm
Lead Channel Impedance Value: 380 Ohm
Lead Channel Pacing Threshold Amplitude: 0.625 V
Lead Channel Pacing Threshold Pulse Width: 0.4 ms
Lead Channel Sensing Intrinsic Amplitude: 0.75 mV
Lead Channel Sensing Intrinsic Amplitude: 7.375 mV
Lead Channel Sensing Intrinsic Amplitude: 7.375 mV
Lead Channel Setting Pacing Amplitude: 2 V
Lead Channel Setting Pacing Pulse Width: 0.4 ms
Lead Channel Setting Sensing Sensitivity: 2.8 mV
Zone Setting Status: 755011

## 2023-10-15 ENCOUNTER — Ambulatory Visit: Payer: Self-pay | Admitting: Cardiovascular Disease

## 2023-11-06 ENCOUNTER — Other Ambulatory Visit: Payer: Self-pay | Admitting: Adult Health

## 2023-11-09 ENCOUNTER — Encounter (HOSPITAL_BASED_OUTPATIENT_CLINIC_OR_DEPARTMENT_OTHER): Payer: Self-pay

## 2023-11-09 ENCOUNTER — Other Ambulatory Visit: Payer: Self-pay

## 2023-11-09 ENCOUNTER — Emergency Department (HOSPITAL_BASED_OUTPATIENT_CLINIC_OR_DEPARTMENT_OTHER)

## 2023-11-09 ENCOUNTER — Emergency Department (HOSPITAL_BASED_OUTPATIENT_CLINIC_OR_DEPARTMENT_OTHER)
Admission: EM | Admit: 2023-11-09 | Discharge: 2023-11-09 | Disposition: A | Discharge status: Home / Self Care | Attending: Emergency Medicine | Admitting: Emergency Medicine

## 2023-11-09 DIAGNOSIS — I5032 Chronic diastolic (congestive) heart failure: Secondary | ICD-10-CM | POA: Diagnosis not present

## 2023-11-09 DIAGNOSIS — D649 Anemia, unspecified: Secondary | ICD-10-CM | POA: Insufficient documentation

## 2023-11-09 DIAGNOSIS — E860 Dehydration: Secondary | ICD-10-CM | POA: Insufficient documentation

## 2023-11-09 DIAGNOSIS — R944 Abnormal results of kidney function studies: Secondary | ICD-10-CM | POA: Insufficient documentation

## 2023-11-09 DIAGNOSIS — I110 Hypertensive heart disease with heart failure: Secondary | ICD-10-CM | POA: Diagnosis not present

## 2023-11-09 DIAGNOSIS — Z950 Presence of cardiac pacemaker: Secondary | ICD-10-CM | POA: Diagnosis not present

## 2023-11-09 DIAGNOSIS — Z7901 Long term (current) use of anticoagulants: Secondary | ICD-10-CM | POA: Insufficient documentation

## 2023-11-09 DIAGNOSIS — R531 Weakness: Secondary | ICD-10-CM | POA: Diagnosis present

## 2023-11-09 HISTORY — DX: Unspecified hearing loss, unspecified ear: H91.90

## 2023-11-09 LAB — LACTIC ACID, PLASMA
Lactic Acid, Venous: 2.1 mmol/L (ref 0.5–1.9)
Lactic Acid, Venous: 1.5 mmol/L (ref 0.5–1.9)

## 2023-11-09 LAB — COMPREHENSIVE METABOLIC PANEL WITH GFR
ALT: 16 U/L (ref 0–44)
AST: 47 U/L — ABNORMAL HIGH (ref 15–41)
Albumin: 3.1 g/dL — ABNORMAL LOW (ref 3.5–5.0)
Alkaline Phosphatase: 93 U/L (ref 38–126)
Anion gap: 13 (ref 5–15)
BUN: 36 mg/dL — ABNORMAL HIGH (ref 8–23)
CO2: 20 mmol/L — ABNORMAL LOW (ref 22–32)
Calcium: 8.8 mg/dL — ABNORMAL LOW (ref 8.9–10.3)
Chloride: 104 mmol/L (ref 98–111)
Creatinine, Ser: 1.68 mg/dL — ABNORMAL HIGH (ref 0.61–1.24)
GFR, Estimated: 37 mL/min — ABNORMAL LOW (ref >60)
Glucose, Bld: 96 mg/dL (ref 70–99)
Potassium: 4.6 mmol/L (ref 3.5–5.1)
Sodium: 136 mmol/L (ref 135–145)
Total Bilirubin: 0.6 mg/dL (ref 0.0–1.2)
Total Protein: 7.1 g/dL (ref 6.5–8.1)

## 2023-11-09 LAB — URINALYSIS, ROUTINE W REFLEX MICROSCOPIC
Bilirubin Urine: NEGATIVE
Glucose, UA: NEGATIVE mg/dL
Ketones, ur: NEGATIVE mg/dL
Leukocytes,Ua: NEGATIVE
Nitrite: NEGATIVE
Specific Gravity, Urine: 1.024 (ref 1.005–1.030)
pH: 5 (ref 5.0–8.0)

## 2023-11-09 LAB — CBC
HCT: 36.3 % — ABNORMAL LOW (ref 39.0–52.0)
Hemoglobin: 11.9 g/dL — ABNORMAL LOW (ref 13.0–17.0)
MCH: 32.4 pg (ref 26.0–34.0)
MCHC: 32.8 g/dL (ref 30.0–36.0)
MCV: 98.9 fL (ref 80.0–100.0)
Platelets: 219 10*3/uL (ref 150–400)
RBC: 3.67 MIL/uL — ABNORMAL LOW (ref 4.22–5.81)
RDW: 14.1 % (ref 11.5–15.5)
WBC: 13 10*3/uL — ABNORMAL HIGH (ref 4.0–10.5)
nRBC: 0 % (ref 0.0–0.2)

## 2023-11-09 LAB — TROPONIN T, HIGH SENSITIVITY
Troponin T High Sensitivity: 27 ng/L — ABNORMAL HIGH (ref <19)
Troponin T High Sensitivity: 25 ng/L — ABNORMAL HIGH (ref <19)

## 2023-11-09 MED ORDER — LACTATED RINGERS IV BOLUS
500.0000 mL | Freq: Once | INTRAVENOUS | Status: AC
  Administered 2023-11-09: 500 mL via INTRAVENOUS

## 2023-11-09 NOTE — ED Triage Notes (Signed)
 BIB daughter, reports diarrhea on Sunday, resolved w/ antidiarrheal meds. Pt reports increased fatigue, generalized weakness, decreased PO intake/appetite, decreased mobility since. Family reports some increased confusion from baseline. Denies abd pain/recent falls

## 2023-11-09 NOTE — ED Notes (Signed)
 Pt also c/o lower back pain, decreased mobility of lower extremities

## 2023-11-09 NOTE — ED Notes (Signed)
 Ambulates around room with assist x1. Reports to be at baseline- family agrees.

## 2023-11-09 NOTE — ED Provider Notes (Signed)
 [place] EMERGENCY DEPARTMENT AT [facility] Provider Note   CSN: [id number] Arrival date & time: 11/09/23  1520     History {Add pertinent medical, surgical, social history, OB history to HPI:1} Chief Complaint  Patient presents with   Weakness    [name] is a 89 y.o. male.  HPI      89 year old male with a history of chronic diastolic heart failure, send on Eliquis , symptomatic heart block status post Medtronic pacemaker, hypertension   Energy and mobility gone.  Used to have good energy and all of a sudden energy is gone. Had diarrhea Sunday AM and going downhill since then.  Had 2 episodes of diarrhea, then used antidiarrheal medicine and it stopped.  No abdominal pain. Low back arthritis.  No cp or dyspnea. No nausea or vomiting. No known fevers. No problems urinating.  Feels weak, doesn't have normal balance. No falls.  No headache.  Not much of an appetite per family.   No black or bloody stools.  No leg swelling or dyspnea. Just moving slowly.   Hx macular degeneration   Past Medical History:  Diagnosis Date   Hard of hearing    Macular degeneration    Osteoporosis    Sick sinus syndrome (HCC)    a. s/p MDT dual chamber PPM implant 11/2014    Home Medications Prior to Admission medications   Medication Sig Start Date End Date Taking? Authorizing Provider  acetaminophen  (TYLENOL ) 500 MG tablet Take 500-1,000 mg by mouth every 6 (six) hours as needed for moderate pain (pain score 4-6).    [provider]  apixaban  (ELIQUIS ) 5 MG TABS tablet Take 1 tablet (5 mg total) by mouth 2 (two) times daily. 04/01/23   Tylene Galla, PA-C  Ascorbic Acid (VITAMIN C PO) Take 1 tablet by mouth daily.    [provider]  cholecalciferol (VITAMIN D) 1000 UNITS tablet Take 1,000 Units by mouth daily.    [provider]  Cyanocobalamin (B-12 PO) Take 1 tablet by mouth daily.    [provider]  ferrous sulfate 325 (65  FE) MG tablet Take 325 mg by mouth daily with breakfast.    [provider]  furosemide  (LASIX ) 20 MG tablet Take 0.5 tablets (10 mg total) by mouth daily. 05/04/23   Tania Familia, NP  spironolactone  (ALDACTONE ) 25 MG tablet TAKE 1 TABLET(25 MG) BY MOUTH DAILY 11/08/23   Tania Familia, NP  traMADol  (ULTRAM ) 50 MG tablet Take 50 mg by mouth every 8 (eight) hours as needed for moderate pain (pain score 4-6). 03/04/20   [provider]  zolpidem  (AMBIEN ) 5 MG tablet Take 5 mg by mouth at bedtime.    [provider]      Allergies    Patient has no known allergies.    Review of Systems   Review of Systems  Physical Exam Updated Vital Signs BP 121/70   Pulse 61   Temp 97.8 F (36.6 C) (Oral)   Resp 19   Ht 5\' 10"  (1.778 m)   Wt 72.6 kg   SpO2 97%   BMI 22.96 kg/m  Physical Exam  ED Results / Procedures / Treatments   Labs (all labs ordered are listed, but only abnormal results are displayed) Labs Reviewed  COMPREHENSIVE METABOLIC PANEL WITH GFR  CBC  URINALYSIS, ROUTINE W REFLEX MICROSCOPIC  LACTIC ACID, PLASMA  LACTIC ACID, PLASMA  CBG MONITORING, ED  TROPONIN T, HIGH SENSITIVITY    EKG  EKG Interpretation Date/Time:  Tuesday November 09 2023 15:47:42 EDT Ventricular Rate:  60 PR Interval:    QRS Duration:  168 QT Interval:  446 QTC Calculation: 446 R Axis:   158  Text Interpretation: VENTRICULAR PACED RHYTHM No significant change since last tracing Confirmed by Scarlette Currier ([id number]) on 11/09/2023 4:17:11 PM  Radiology No results found.  Procedures Procedures  {Document cardiac monitor, telemetry assessment procedure when appropriate:1}  Medications Ordered in ED Medications - No data to display  ED Course/ Medical Decision Making/ A&P   {   Click here for ABCD2, HEART and other calculatorsREFRESH Note before signing :1}                              Medical Decision Making Amount and/or Complexity of Data Reviewed Labs:  ordered. Radiology: ordered.   ***  {Document critical care time when appropriate:1} {Document review of labs and clinical decision tools ie heart score, Chads2Vasc2 etc:1}  {Document your independent review of radiology images, and any outside records:1} {Document your discussion with family members, caretakers, and with consultants:1} {Document social determinants of health affecting pt's care:1} {Document your decision making why or why not admission, treatments were needed:1} Final Clinical Impression(s) / ED Diagnoses Final diagnoses:  None    Rx / DC Orders ED Discharge Orders     None

## 2023-11-23 NOTE — Progress Notes (Signed)
 Remote pacemaker transmission.

## 2024-01-10 ENCOUNTER — Ambulatory Visit: Payer: Medicare Other

## 2024-01-13 ENCOUNTER — Ambulatory Visit (INDEPENDENT_AMBULATORY_CARE_PROVIDER_SITE_OTHER): Payer: Medicare Other

## 2024-01-13 DIAGNOSIS — I442 Atrioventricular block, complete: Secondary | ICD-10-CM

## 2024-01-13 LAB — CUP PACEART REMOTE DEVICE CHECK
Battery Remaining Longevity: 143 mo
Battery Voltage: 3.16 V
Brady Statistic AP VP Percent: 0 %
Brady Statistic AP VS Percent: 0 %
Brady Statistic AS VP Percent: 99.02 %
Brady Statistic AS VS Percent: 0.98 %
Brady Statistic RA Percent Paced: 0 %
Brady Statistic RV Percent Paced: 99.02 %
Date Time Interrogation Session: 20250813195048
Implantable Lead Connection Status: 753985
Implantable Lead Connection Status: 753985
Implantable Lead Implant Date: 20160724
Implantable Lead Implant Date: 20160724
Implantable Lead Location: 753859
Implantable Lead Location: 753860
Implantable Lead Model: 5076
Implantable Lead Model: 5076
Implantable Pulse Generator Implant Date: 20241114
Lead Channel Impedance Value: 285 Ohm
Lead Channel Impedance Value: 342 Ohm
Lead Channel Impedance Value: 342 Ohm
Lead Channel Impedance Value: 380 Ohm
Lead Channel Pacing Threshold Amplitude: 0.75 V
Lead Channel Pacing Threshold Pulse Width: 0.4 ms
Lead Channel Sensing Intrinsic Amplitude: 0.75 mV
Lead Channel Sensing Intrinsic Amplitude: 7.375 mV
Lead Channel Sensing Intrinsic Amplitude: 7.375 mV
Lead Channel Setting Pacing Amplitude: 2 V
Lead Channel Setting Pacing Pulse Width: 0.4 ms
Lead Channel Setting Sensing Sensitivity: 2.8 mV
Zone Setting Status: 755011

## 2024-01-20 ENCOUNTER — Ambulatory Visit: Payer: Self-pay | Admitting: Cardiovascular Disease

## 2024-02-24 NOTE — Progress Notes (Signed)
 Remote PPM Transmission

## 2024-04-10 ENCOUNTER — Ambulatory Visit: Payer: Medicare Other

## 2024-04-10 ENCOUNTER — Other Ambulatory Visit: Payer: Self-pay | Admitting: Student

## 2024-04-10 DIAGNOSIS — I4819 Other persistent atrial fibrillation: Secondary | ICD-10-CM

## 2024-04-10 NOTE — Telephone Encounter (Signed)
 I'd give 30 day and have him repeat BMET.  IF Scr is > 1.5 again, then decrease the dose.  He's also past due for f/u with [facility].

## 2024-04-10 NOTE — Telephone Encounter (Signed)
 Eliquis  5mg  refill request received. Patient is 89 years old, weight-72.6kg, Crea-1.68 on 11/09/23 (creatinine trends are 09/22/23-1.44, 07/09/23-1.44, 06/03/23-1.66, 12/324-1.36, 04/30/23-1.27) Diagnosis-Afib, and last seen by [facility] on 09/22/23.   Will ask Pharmd since a recent elevated creatinine level.

## 2024-04-11 NOTE — Telephone Encounter (Addendum)
 Spoke with Daughter, Devere, on DPR and advised that eliquis  dose was being evaluated due to kidney function and labs need to be drawn within a month to evaluate if dose needs to be changed or not on his eliquis . She is aware a 30 day supply will be sent at time until labs resulted.  Daughter will bring patient on 05/03/24 for lab drawn, BMET order placed and released.  Message sent to Schedulers for an appt

## 2024-04-12 NOTE — Progress Notes (Signed)
 HPI: Follow-up chronic diastolic congestive heart failure, persistent atrial fibrillation, history of pacemaker and hypertension.  This is my first time seeing the patient.  Echocardiogram November 2024 showed normal LV function, mild left ventricular hypertrophy, mild RV dysfunction/mild right ventricular enlargement, severe biatrial enlargement, mild mitral regurgitation, trace aortic insufficiency.  Since last seen patient denies dyspnea, chest pain, palpitations, syncope or bleeding.  He has chronic pedal edema by his report.  Current Outpatient Medications  Medication Sig Dispense Refill   acetaminophen  (TYLENOL ) 500 MG tablet Take 500-1,000 mg by mouth every 6 (six) hours as needed for moderate pain (pain score 4-6).     apixaban  (ELIQUIS ) 5 MG TABS tablet TAKE ONE TABLET TWICE DAILY 60 tablet 0   Ascorbic Acid (VITAMIN C PO) Take 1 tablet by mouth daily.     cholecalciferol (VITAMIN D) 1000 UNITS tablet Take 1,000 Units by mouth daily.     Cyanocobalamin (B-12 PO) Take 1 tablet by mouth daily.     ferrous sulfate 325 (65 FE) MG tablet Take 325 mg by mouth daily with breakfast.     furosemide  (LASIX ) 20 MG tablet Take 0.5 tablets (10 mg total) by mouth daily. 90 tablet 1   spironolactone  (ALDACTONE ) 25 MG tablet TAKE 1 TABLET(25 MG) BY MOUTH DAILY 90 tablet 3   traMADol  (ULTRAM ) 50 MG tablet Take 50 mg by mouth every 8 (eight) hours as needed for moderate pain (pain score 4-6).     zolpidem  (AMBIEN ) 5 MG tablet Take 5 mg by mouth at bedtime.     No current facility-administered medications for this visit.     Past Medical History:  Diagnosis Date   Hard of hearing    Macular degeneration    Osteoporosis    Sick sinus syndrome [facility])    a. s/p MDT dual chamber PPM implant 11/2014    Past Surgical History:  Procedure Laterality Date   PACEMAKER INSERTION  11/2014   MDT dual chamber PPM implanted for SSS    PPM GENERATOR CHANGEOUT N/A 04/15/2023   Procedure: PPM GENERATOR  CHANGEOUT;  Surgeon: Mealor, Eulas BRAVO, MD;  Location: [facility] INVASIVE CV LAB;  Service: Cardiovascular;  Laterality: N/A;    Social History   Socioeconomic History   Marital status: Widowed    Spouse name: Not on file   Number of children: Not on file   Years of education: Not on file   Highest education level: Not on file  Occupational History   Not on file  Tobacco Use   Smoking status: Never   Smokeless tobacco: Never  Substance and Sexual Activity   Alcohol use: Not on file   Drug use: Not on file   Sexual activity: Not on file  Other Topics Concern   Not on file  Social History Narrative   Not on file   Social Drivers of Health   Financial Resource Strain: Not on file  Food Insecurity: No Food Insecurity (04/29/2023)   Hunger Vital Sign    Worried About Running Out of Food in the Last Year: Never true    Ran Out of Food in the Last Year: Never true  Transportation Needs: No Transportation Needs (04/29/2023)   PRAPARE - Administrator, Civil Service (Medical): No    Lack of Transportation (Non-Medical): No  Physical Activity: Not on file  Stress: Not on file  Social Connections: Not on file  Intimate Partner Violence: Not At Risk (04/29/2023)   Humiliation, Afraid, Rape,  and Kick questionnaire    Fear of Current or Ex-Partner: No    Emotionally Abused: No    Physically Abused: No    Sexually Abused: No    Family History  Problem Relation Age of Onset   Other Mother        OLD AGE   Stroke Father    Heart attack Brother    Cancer Brother    Cancer Brother    Healthy Son    Healthy Daughter     ROS: no fevers or chills, productive cough, hemoptysis, dysphasia, odynophagia, melena, hematochezia, dysuria, hematuria, rash, seizure activity, orthopnea, PND, pedal edema, claudication. Remaining systems are negative.  Physical Exam: Well-developed well-nourished in no acute distress.  Skin is warm and dry.  HEENT is normal.  Neck is supple.   Chest is clear to auscultation with normal expansion.  Cardiovascular exam is regular rate and rhythm.  Abdominal exam nontender or distended. No masses palpated. Extremities show 1+ edema. neuro grossly intact   A/P  1 chronic diastolic congestive heart failure-patient appears to be euvolemic on examination.  Continue diuretic at present dose.  He is scheduled to have potassium and renal function checked in 3 weeks.  2 persistent atrial fibrillation-plan to continue apixaban  at present dose.  He is scheduled to have creatinine checked in 3 weeks.  If it remains greater than 1.5 we will reduce apixaban  dose to 2.5 mg twice daily.  3 history of complete heart block status post permanent pacemaker-followed by electrophysiology.  4 hypertension-patient's blood pressure is controlled.  Continue present medical regimen.  Redell Shallow, MD

## 2024-04-13 ENCOUNTER — Ambulatory Visit (INDEPENDENT_AMBULATORY_CARE_PROVIDER_SITE_OTHER): Payer: Medicare Other

## 2024-04-13 ENCOUNTER — Encounter: Payer: Self-pay | Admitting: Cardiology

## 2024-04-13 ENCOUNTER — Ambulatory Visit: Attending: Cardiology | Admitting: Cardiology

## 2024-04-13 VITALS — BP 110/60 | HR 63 | Ht 70.0 in | Wt 164.0 lb

## 2024-04-13 DIAGNOSIS — I5032 Chronic diastolic (congestive) heart failure: Secondary | ICD-10-CM

## 2024-04-13 DIAGNOSIS — I4819 Other persistent atrial fibrillation: Secondary | ICD-10-CM

## 2024-04-13 DIAGNOSIS — Z950 Presence of cardiac pacemaker: Secondary | ICD-10-CM

## 2024-04-13 DIAGNOSIS — I10 Essential (primary) hypertension: Secondary | ICD-10-CM | POA: Diagnosis not present

## 2024-04-13 LAB — CUP PACEART REMOTE DEVICE CHECK
Battery Remaining Longevity: 140 mo
Battery Voltage: 3.11 V
Brady Statistic AP VP Percent: 0 %
Brady Statistic AP VS Percent: 0 %
Brady Statistic AS VP Percent: 98.49 %
Brady Statistic AS VS Percent: 1.51 %
Brady Statistic RA Percent Paced: 0 %
Brady Statistic RV Percent Paced: 98.49 %
Date Time Interrogation Session: 20251112233640
Implantable Lead Connection Status: 753985
Implantable Lead Connection Status: 753985
Implantable Lead Implant Date: 20160724
Implantable Lead Implant Date: 20160724
Implantable Lead Location: 753859
Implantable Lead Location: 753860
Implantable Lead Model: 5076
Implantable Lead Model: 5076
Implantable Pulse Generator Implant Date: 20241114
Lead Channel Impedance Value: 285 Ohm
Lead Channel Impedance Value: 323 Ohm
Lead Channel Impedance Value: 342 Ohm
Lead Channel Impedance Value: 380 Ohm
Lead Channel Pacing Threshold Amplitude: 0.75 V
Lead Channel Pacing Threshold Pulse Width: 0.4 ms
Lead Channel Sensing Intrinsic Amplitude: 0.75 mV
Lead Channel Sensing Intrinsic Amplitude: 7.125 mV
Lead Channel Sensing Intrinsic Amplitude: 7.125 mV
Lead Channel Setting Pacing Amplitude: 2 V
Lead Channel Setting Pacing Pulse Width: 0.4 ms
Lead Channel Setting Sensing Sensitivity: 2.8 mV
Zone Setting Status: 755011

## 2024-04-13 NOTE — Patient Instructions (Signed)

## 2024-04-18 NOTE — Progress Notes (Signed)
 Remote PPM Transmission

## 2024-05-01 ENCOUNTER — Ambulatory Visit: Payer: Self-pay | Admitting: Cardiovascular Disease

## 2024-05-04 ENCOUNTER — Ambulatory Visit: Payer: Self-pay | Admitting: Cardiology

## 2024-05-04 LAB — BASIC METABOLIC PANEL WITH GFR
BUN/Creatinine Ratio: 15 (ref 10–24)
BUN: 20 mg/dL (ref 10–36)
CO2: 21 mmol/L (ref 20–29)
Calcium: 8.8 mg/dL (ref 8.6–10.2)
Chloride: 103 mmol/L (ref 96–106)
Creatinine, Ser: 1.32 mg/dL — ABNORMAL HIGH (ref 0.76–1.27)
Glucose: 108 mg/dL — ABNORMAL HIGH (ref 70–99)
Potassium: 4 mmol/L (ref 3.5–5.2)
Sodium: 140 mmol/L (ref 134–144)
eGFR: 49 mL/min/1.73 — ABNORMAL LOW (ref >59)

## 2024-05-05 ENCOUNTER — Other Ambulatory Visit: Payer: Self-pay | Admitting: *Deleted

## 2024-05-05 DIAGNOSIS — I4819 Other persistent atrial fibrillation: Secondary | ICD-10-CM

## 2024-05-05 MED ORDER — APIXABAN 5 MG PO TABS: 5.0000 mg | ORAL_TABLET | Freq: Two times a day (BID) | ORAL | 3 refills | Status: AC

## 2024-05-05 NOTE — Telephone Encounter (Signed)
 Eliquis  5mg  refill request received. Patient is 89 years old, weight-74.4kg, Crea-1.32 on 05/03/24, Diagnosis-Afib, and last seen by Dr. Pietro on 04/13/24. Dose is appropriate based on dosing criteria. Will send in refill to requested pharmacy.

## 2024-05-25 ENCOUNTER — Other Ambulatory Visit: Payer: Self-pay | Admitting: Adult Health

## 2024-06-29 ENCOUNTER — Other Ambulatory Visit: Payer: Self-pay | Admitting: Adult Health

## 2024-07-03 ENCOUNTER — Telehealth: Payer: Self-pay | Admitting: Cardiology

## 2024-07-03 NOTE — Telephone Encounter (Signed)
" °*  STAT* If patient is at the pharmacy, call can be transferred to refill team.   1. Which medications need to be refilled? (please list name of each medication and dose if known) furosemide  (LASIX ) 20 MG tablet   NEW PHARMACY   4. Which pharmacy/location (including street and city if local pharmacy) is medication to be sent to? [facility], [address] [place], [place] - [address] Phone: [phone]  Fax: [phone]       5. Do they need a 30 day or 90 day supply? 90   "

## 2024-07-05 MED ORDER — FUROSEMIDE 20 MG PO TABS: 10.0000 mg | ORAL_TABLET | Freq: Every day | ORAL | 3 refills | Status: AC

## 2024-07-10 ENCOUNTER — Ambulatory Visit: Payer: Medicare Other

## 2024-10-09 ENCOUNTER — Ambulatory Visit: Payer: Medicare Other

## 2025-01-08 ENCOUNTER — Ambulatory Visit: Payer: Medicare Other
